# Patient Record
Sex: Female | Born: 1985 | Race: Black or African American | Hispanic: No | Marital: Married | Smoking: Never smoker
Health system: Southern US, Community
[De-identification: ages and names within clinical notes are randomized; demographics above are authoritative.]

## PROBLEM LIST (undated history)

## (undated) ENCOUNTER — Inpatient Hospital Stay (HOSPITAL_COMMUNITY): Payer: Self-pay

## (undated) DIAGNOSIS — M67432 Ganglion, left wrist: Secondary | ICD-10-CM

## (undated) DIAGNOSIS — R1032 Left lower quadrant pain: Secondary | ICD-10-CM

## (undated) DIAGNOSIS — I309 Acute pericarditis, unspecified: Secondary | ICD-10-CM

## (undated) DIAGNOSIS — Z01419 Encounter for gynecological examination (general) (routine) without abnormal findings: Secondary | ICD-10-CM

## (undated) DIAGNOSIS — K219 Gastro-esophageal reflux disease without esophagitis: Secondary | ICD-10-CM

## (undated) DIAGNOSIS — E669 Obesity, unspecified: Secondary | ICD-10-CM

## (undated) DIAGNOSIS — R5383 Other fatigue: Secondary | ICD-10-CM

## (undated) HISTORY — DX: Gastro-esophageal reflux disease without esophagitis: K21.9

## (undated) HISTORY — PX: NO PAST SURGERIES: SHX2092

## (undated) HISTORY — DX: Encounter for gynecological examination (general) (routine) without abnormal findings: Z01.419

## (undated) HISTORY — DX: Acute pericarditis, unspecified: I30.9

## (undated) HISTORY — DX: Left lower quadrant pain: R10.32

## (undated) HISTORY — DX: Obesity, unspecified: E66.9

## (undated) HISTORY — PX: TONSILLECTOMY: SUR1361

## (undated) HISTORY — DX: Other fatigue: R53.83

## (undated) HISTORY — DX: Ganglion, left wrist: M67.432

---

## 2010-04-09 ENCOUNTER — Emergency Department (HOSPITAL_COMMUNITY): Admission: EM | Admit: 2010-04-09 | Discharge: 2010-04-09 | Payer: Self-pay | Admitting: Family Medicine

## 2010-06-27 ENCOUNTER — Other Ambulatory Visit: Admission: RE | Admit: 2010-06-27 | Discharge: 2010-06-27 | Payer: Self-pay | Admitting: Family Medicine

## 2010-06-27 ENCOUNTER — Ambulatory Visit: Payer: Self-pay | Admitting: Family Medicine

## 2010-06-27 DIAGNOSIS — B009 Herpesviral infection, unspecified: Secondary | ICD-10-CM

## 2010-06-27 HISTORY — DX: Herpesviral infection, unspecified: B00.9

## 2010-07-01 ENCOUNTER — Telehealth: Payer: Self-pay | Admitting: Family Medicine

## 2010-07-04 ENCOUNTER — Encounter (INDEPENDENT_AMBULATORY_CARE_PROVIDER_SITE_OTHER): Payer: Self-pay | Admitting: *Deleted

## 2010-07-09 ENCOUNTER — Ambulatory Visit: Payer: Self-pay | Admitting: Family Medicine

## 2010-07-09 LAB — CONVERTED CEMR LAB
BUN: 12 mg/dL (ref 6–23)
Basophils Absolute: 0 10*3/uL (ref 0.0–0.1)
CO2: 26 meq/L (ref 19–32)
Calcium: 9.6 mg/dL (ref 8.4–10.5)
Chloride: 110 meq/L (ref 96–112)
Creatinine, Ser: 0.7 mg/dL (ref 0.4–1.2)
Eosinophils Relative: 2.3 % (ref 0.0–5.0)
Glucose, Bld: 75 mg/dL (ref 70–99)
HDL: 62.3 mg/dL (ref >39.00)
LDL Cholesterol: 91 mg/dL (ref 0–99)
MCV: 86.2 fL (ref 78.0–100.0)
Monocytes Absolute: 0.5 10*3/uL (ref 0.1–1.0)
Monocytes Relative: 9.5 % (ref 3.0–12.0)
Neutrophils Relative %: 38.6 % — ABNORMAL LOW (ref 43.0–77.0)
Platelets: 205 10*3/uL (ref 150.0–400.0)
RDW: 12.3 % (ref 11.5–14.6)
TSH: 1.17 microintl units/mL (ref 0.35–5.50)
VLDL: 18.6 mg/dL (ref 0.0–40.0)
WBC: 5.7 10*3/uL (ref 4.5–10.5)

## 2010-09-10 ENCOUNTER — Telehealth (INDEPENDENT_AMBULATORY_CARE_PROVIDER_SITE_OTHER): Payer: Self-pay | Admitting: *Deleted

## 2010-11-14 ENCOUNTER — Ambulatory Visit
Admission: RE | Admit: 2010-11-14 | Discharge: 2010-11-14 | Payer: Self-pay | Source: Home / Self Care | Attending: Family Medicine | Admitting: Family Medicine

## 2010-11-14 DIAGNOSIS — J019 Acute sinusitis, unspecified: Secondary | ICD-10-CM | POA: Insufficient documentation

## 2010-11-25 NOTE — Progress Notes (Signed)
Summary: NUVARING REFILL--WANTS CHANGE  Phone Note Refill Request Message from:  Fax from Pharmacy on July 01, 2010 2:27 PM  Refills Requested: Medication #1:  NUVARING 0.12-0.015 MG/24HR RING insert as directed.  remove after 3 weeks.  insert new ring after 1 week.Marland Kitchen [facility], [address], [place]    FAX = [phone]   ***HANDWRITTEN NOTE FROM PHARMACY ON FAX--"PT REQUESTS CHANGE TO ORTHO TRI-CYCLEN FOR CUST SAVINGS.   MAY WE CHANGE?"  Initial call taken by: Jerolyn Shin,  July 01, 2010 2:47 PM  Follow-up for Phone Call        Please advise of change. Lucious Groves CMA  July 01, 2010 2:50 PM   Additional Follow-up for Phone Call Additional follow up Details #1::        ok to change.  disp 1 pill pack w/ 11 refills Additional Follow-up by: Neena Rhymes MD,  July 01, 2010 3:08 PM    New/Updated Medications: ORTHO TRI-CYCLEN LO 0.18/0.215/0.25 MG-25 MCG TABS (NORGESTIM-ETH ESTRAD TRIPHASIC) 1 by mouth once daily as directed Prescriptions: ORTHO TRI-CYCLEN LO 0.18/0.215/0.25 MG-25 MCG TABS (NORGESTIM-ETH ESTRAD TRIPHASIC) 1 by mouth once daily as directed  #1 pill pack x 11   Entered by:   Lucious Groves CMA   Authorized by:   Neena Rhymes MD   Signed by:   Lucious Groves CMA on 07/01/2010   Method used:   Faxed to ...       [facility]* (retail)       [address] [place], [place]  [address]       Ph: [phone]       Fax: [phone]   RxID:   [id number]

## 2010-11-25 NOTE — Progress Notes (Signed)
Summary: new rx  Phone Note Call from Patient Call back at Home Phone [phone]   Caller: Patient Summary of Call: patient wants rx for nuvaring - she said ins will pay for it now - [facility] mail order Initial call taken by: Okey Regal [place],  September 10, 2010 10:27 AM  Follow-up for Phone Call        spoke w/ patient aware prescription sent to [facility].......Marland KitchenDoristine Devoid CMA  September 10, 2010 11:03 AM     New/Updated Medications: NUVARING 0.12-0.015 MG/24HR RING (ETONOGESTREL-ETHINYL ESTRADIOL) insert as directed, remove after 3 weeks then insert new ring after 1 wk Prescriptions: NUVARING 0.12-0.015 MG/24HR RING (ETONOGESTREL-ETHINYL ESTRADIOL) insert as directed, remove after 3 weeks then insert new ring after 1 wk  #68month x 3   Entered by:   Doristine Devoid CMA   Authorized by:   Neena Rhymes MD   Signed by:   Doristine Devoid CMA on 09/10/2010   Method used:   Faxed to ...       [facility] (mail-order)             , [place]         Ph: [phone]       Fax: [phone]   RxID:   [id number]

## 2010-11-25 NOTE — Letter (Signed)
Summary: Results Follow up Letter  [facility]  [address] [place], [place] [address]   Phone: [phone]  Fax: [phone]    07/04/2010 MRN: [id number]  Janice Raymond [address] [place], [place]  [address]  Dear Ms. [name],  The following are the results of your recent test(s):  Test         Result    Pap Smear:        Normal __X___  Not Normal _____ Comments: REPEAT 1 YR. ______________________________________________________ Cholesterol: LDL(Bad cholesterol):         Your goal is less than:         HDL (Good cholesterol):       Your goal is more than: Comments:  ______________________________________________________ Mammogram:        Normal _____  Not Normal _____ Comments:  ___________________________________________________________________ Hemoccult:        Normal _____  Not normal _______ Comments:    _____________________________________________________________________ Other Tests:    We routinely do not discuss normal results over the telephone.  If you desire a copy of the results, or you have any questions about this information we can discuss them at your next office visit.   Sincerely,

## 2010-11-25 NOTE — Assessment & Plan Note (Signed)
Summary: cpx and pap   Vital Signs:  Patient profile:   25 year old female Height:      64.50 inches Weight:      149 pounds BMI:     25.27 Pulse rate:   113 / minute BP sitting:   100 / 64  (left arm)  Vitals Entered By: Doristine Devoid CMA (June 27, 2010 2:25 PM) CC: NEW EST- CPX AND PAP- would like to switch to nuva ring   History of Present Illness: 25 yo woman here today to establish care.  previous MD- [facility].  no concerns about health.  wants to start nuvaring for birth control.      Preventive Screening-Counseling & Management  Alcohol-Tobacco     Alcohol drinks/day: <1     Smoking Status: never  Caffeine-Diet-Exercise     Does Patient Exercise: yes     Type of exercise: dance, running      Sexual History:  currently monogamous.        Drug Use:  never.    Current Medications (verified): 1)  Trinessa (28) 0.18/0.215/0.25 Mg-35 Mcg Tabs (Norgestim-Eth Estrad Triphasic) .... Take Daily 2)  Acyclovir 400 Mg Tabs (Acyclovir) .... Take One Tablet Two Times A Day 3)  Nuvaring 0.12-0.015 Mg/24hr Ring (Etonogestrel-Ethinyl Estradiol) .... Insert As Directed.  Remove After 3 Weeks.  Insert New Ring After 1 Week.  Allergies (verified): No Known Drug Allergies  Past History:  Past Medical History: HSV  Past Surgical History: cyst removal L wrist  Family History: CAD-no HTN-grandparents DM-grandparents, mother STROKE-great aunt COLON CA-no BREAST CA-maternal aunt (dx'd at age 66)  Social History: recent grad from [place] works for [employer] lives alone no pets engagedSmoking Status:  never Does Patient Exercise:  yes Sexual History:  currently monogamous Drug Use:  never  Review of Systems  The patient denies anorexia, fever, weight loss, weight gain, vision loss, decreased hearing, hoarseness, chest pain, syncope, dyspnea on exertion, peripheral edema, prolonged cough, headaches, abdominal pain, melena,  hematochezia, severe indigestion/heartburn, hematuria, suspicious skin lesions, depression, abnormal bleeding, enlarged lymph nodes, and breast masses.    Physical Exam  General:  Well-developed,well-nourished,in no acute distress; alert,appropriate and cooperative throughout examination Head:  Normocephalic and atraumatic without obvious abnormalities. No apparent alopecia or balding. Eyes:  No corneal or conjunctival inflammation noted. EOMI. Perrla. Funduscopic exam benign, without hemorrhages, exudates or papilledema. Vision grossly normal. Ears:  External ear exam shows no significant lesions or deformities.  Otoscopic examination reveals clear canals, tympanic membranes are intact bilaterally without bulging, retraction, inflammation or discharge. Hearing is grossly normal bilaterally. Nose:  External nasal examination shows no deformity or inflammation. Nasal mucosa are pink and moist without lesions or exudates. Mouth:  Oral mucosa and oropharynx without lesions or exudates.  Teeth in good repair. Neck:  No deformities, masses, or tenderness noted. Breasts:  No mass, nodules, thickening, tenderness, bulging, retraction, inflamation, nipple discharge or skin changes noted.   Lungs:  Normal respiratory effort, chest expands symmetrically. Lungs are clear to auscultation, no crackles or wheezes. Heart:  Normal rate and regular rhythm. S1 and S2 normal without gallop, murmur, click, rub or other extra sounds. Abdomen:  Bowel sounds positive,abdomen soft and non-tender without masses, organomegaly or hernias noted. Genitalia:  Pelvic Exam:        External: normal female genitalia without lesions or masses        Vagina: normal without lesions or masses  Cervix: normal without lesions or masses        Adnexa: normal bimanual exam without masses or fullness        Uterus: normal by palpation        Pap smear: performed Pulses:  +2 carotid, radial, DP Extremities:  No clubbing, cyanosis,  edema, or deformity noted with normal full range of motion of all joints.   Neurologic:  No cranial nerve deficits noted. Station and gait are normal. Plantar reflexes are down-going bilaterally. DTRs are symmetrical throughout. Sensory, motor and coordinative functions appear intact. Skin:  Intact without suspicious lesions or rashes Cervical Nodes:  No lymphadenopathy noted Axillary Nodes:  No palpable lymphadenopathy Psych:  Cognition and judgment appear intact. Alert and cooperative with normal attention span and concentration. No apparent delusions, illusions, hallucinations   Impression & Recommendations:  Problem # 1:  ROUTINE GYNECOLOGICAL EXAMINATION (ICD-V72.31) Assessment New pt's PE WNL.  not fasting today so will need to return for lab work.  anticipatory guidance provided.  Problem # 2:  SCREENING FOR MALIGNANT NEOPLASM OF THE CERVIX (ICD-V76.2) Assessment: New pap collected  Problem # 3:  CONTRACEPTIVE MANAGEMENT (ICD-V25.09) Assessment: New at pt's request will start NuvaRing.  reviewed proper use and timing of start.  Pt expresses understanding and is in agreement w/ this plan.  Complete Medication List: 1)  Acyclovir 400 Mg Tabs (Acyclovir) .... Take one tablet two times a day 2)  Ortho Tri-cyclen Lo 0.18/0.215/0.25 Mg-25 Mcg Tabs (Norgestim-eth estrad triphasic) .Marland Kitchen.. 1 by mouth once daily as directed  Patient Instructions: 1)  Please schedule a lab visit- do not eat before this appt 2)  BMP prior to visit, ICD-9: v70 3)  Hepatic Panel prior to visit ICD-9: v70 4)  Lipid panel prior to visit ICD-9 : v70 5)  TSH prior to visit ICD-9 : v70 6)  CBC w/ Diff prior to visit ICD-9 : v70 7)  Call with any questions or concerns 8)  We'll notify you of your lab results 9)  Keep making healthy food choices and get regular exercise 10)  Insert your Nuva Ring the "Sunday after you start bleeding- if you don't get your period, please take a pregnancy test before inserting the  ring 11)  Welcome!  We're glad to have you! Prescriptions: NUVARING 0.12-0.015 MG/24HR RING (ETONOGESTREL-ETHINYL ESTRADIOL) insert as directed.  remove after 3 weeks.  insert new ring after 1 week.  #1 x 11   Entered and Authorized by:   Katherine Tabori MD   Signed by:   Katherine Tabori MD on 06/27/2010   Method used:   Electronically to        [facility]* (retail)       [address] [place], [place]  [address]       Ph: [phone]       Fax: [phone]   RxID:   [id number]

## 2010-11-27 NOTE — Assessment & Plan Note (Signed)
Summary: headaches, tonsils swollen, sinus for 2 weeks///sph   Vital Signs:  Patient profile:   25 year old female Weight:      151 pounds BMI:     25.61 Temp:     98.4 degrees F oral BP sitting:   110 / 76  (left arm)  Vitals Entered By: Doristine Devoid CMA (November 14, 2010 2:27 PM) CC: sinus Ha and pressure x2 wks    History of Present Illness: 25 yo woman here today for ? sinus infxn.  reports she is feeling 'perfect today'.  has had 'terrible HAs' for last 2 weeks.  pain was behind L eye.  looked on Web MD and started taking Astepro, Excedrin, Benadryl.  no pain today.  no ear pain.  + nasal congestion.  no cough.  no fevers.  + sick contacts.  herpes- has been taking Acyclovir but doesn't feel this is suppressing her sxs.  wants to know if there is better available.  Current Medications (verified): 1)  Acyclovir 400 Mg Tabs (Acyclovir) .... Take One Tablet Two Times A Day 2)  Nuvaring 0.12-0.015 Mg/24hr Ring (Etonogestrel-Ethinyl Estradiol) .... Insert As Directed, Remove After 3 Weeks Then Insert New Ring After 1 Wk  Allergies (verified): No Known Drug Allergies  Review of Systems      See HPI  Physical Exam  General:  Well-developed,well-nourished,in no acute distress; alert,appropriate and cooperative throughout examination Head:  Normocephalic and atraumatic without obvious abnormalities. No apparent alopecia or balding.  mild TTP over L frontal sinus Eyes:  no injxn or inflammation Ears:  External ear exam shows no significant lesions or deformities.  Otoscopic examination reveals clear canals, tympanic membranes are intact bilaterally without bulging, retraction, inflammation or discharge. Hearing is grossly normal bilaterally. Nose:  External nasal examination shows no deformity or inflammation. Nasal mucosa are pink and moist without lesions or exudates. Mouth:  Oral mucosa and oropharynx without lesions or exudates.  Teeth in good repair.  + PND Neck:  No  deformities, masses, or tenderness noted. Lungs:  Normal respiratory effort, chest expands symmetrically. Lungs are clear to auscultation, no crackles or wheezes. Heart:  Normal rate and regular rhythm. S1 and S2 normal without gallop, murmur, click, rub or other extra sounds.   Impression & Recommendations:  Problem # 1:  SINUSITIS - ACUTE-NOS (ICD-461.9) Assessment New resolving w/out need for abx.  pt to continue Astepro and restart her Zyrtec D.  reviewed supportive care and red flags that should prompt return.  Pt expresses understanding and is in agreement w/ this plan.  Problem # 2:  HSV (ICD-054.9) Assessment: Unchanged switch to Valtrex as pt prefers to try a once daily medicine  Complete Medication List: 1)  Valtrex 500 Mg Tabs (Valacyclovir hcl) .Marland Kitchen.. 1 tab daily for suppression 2)  Nuvaring 0.12-0.015 Mg/24hr Ring (Etonogestrel-ethinyl estradiol) .... Insert as directed, remove after 3 weeks then insert new ring after 1 wk  Patient Instructions: 1)  It appears that you had a sinus infection/inflammation that improved 2)  Start Zyrtec D daily until you feel better 3)  Continue the Astepro to shrink the nasal swelling and decrease the post nasal drip 4)  Drink plenty of fluids 5)  Call with any questions or concerns 6)  Hang in there!!! Prescriptions: VALTREX 500 MG TABS (VALACYCLOVIR HCL) 1 tab daily for suppression  #90 x 3   Entered and Authorized by:   Neena Rhymes MD   Signed by:   Neena Rhymes MD on 11/14/2010  Method used:   Faxed to ...       [facility] (mail-order)             , [place]         Ph: [phone]       Fax: [phone]   RxID:   [id number]    Orders Added: 1)  Est. Patient Level III [41324]

## 2010-12-23 ENCOUNTER — Encounter: Payer: Self-pay | Admitting: Family Medicine

## 2010-12-23 ENCOUNTER — Ambulatory Visit (INDEPENDENT_AMBULATORY_CARE_PROVIDER_SITE_OTHER): Payer: 59 | Admitting: Family Medicine

## 2010-12-23 DIAGNOSIS — N898 Other specified noninflammatory disorders of vagina: Secondary | ICD-10-CM | POA: Insufficient documentation

## 2010-12-24 LAB — CONVERTED CEMR LAB
Candida species: POSITIVE — AB
Gardnerella vaginalis: POSITIVE — AB

## 2010-12-25 ENCOUNTER — Telehealth (INDEPENDENT_AMBULATORY_CARE_PROVIDER_SITE_OTHER): Payer: Self-pay | Admitting: *Deleted

## 2011-01-01 NOTE — Progress Notes (Signed)
Summary: wet prep-  Phone Note Outgoing Call   Call placed by: Doristine Devoid CMA,  December 25, 2010 9:17 AM Call placed to: Patient Summary of Call: already being treated for the yeast but also has BV.  should start Metronidazole 500mg  two times a day x7 days.  she had asked about drinking ETOH on meds- if she drinks on the Metronidazole this will make her sick   Follow-up for Phone Call        left message on machine .......Marland KitchenDoristine Devoid CMA  December 25, 2010 9:17 AM   Additional Follow-up for Phone Call Additional follow up Details #1::        Discuss with patient, Rx sent to pharmacy.Marland KitchenMarland KitchenFelecia Deloach CMA  December 25, 2010 10:58 AM     New/Updated Medications: METRONIDAZOLE 500 MG TABS (METRONIDAZOLE) Take 1 tab two times a day x7 days Prescriptions: METRONIDAZOLE 500 MG TABS (METRONIDAZOLE) Take 1 tab two times a day x7 days  #14 x 0   Entered by:   Jeremy Johann CMA   Authorized by:   Neena Rhymes MD   Signed by:   Jeremy Johann CMA on 12/25/2010   Method used:   Faxed to ...       [facility]* (retail)       [address] [place], [place]  [address]       Ph: [phone]       Fax: [phone]   RxID:   [id number]

## 2011-01-01 NOTE — Assessment & Plan Note (Signed)
Summary: vaginal discharge,cbs   Vital Signs:  Patient profile:   25 year old female Weight:      147 pounds BMI:     24.93 Pulse rate:   68 / minute BP sitting:   112 / 70  (left arm)  Vitals Entered By: Doristine Devoid CMA (December 23, 2010 9:20 AM) CC: vaginal discharge xfri   History of Present Illness: 25 yo woman here today for vaginal d/c.  sxs started after using abx for sinus infxn.  used monistat w/ initial sxs relief.  sxs started again friday after sex.  'lots of discharge'- thick, white discharge.  + itching, burning internally.  Current Medications (verified): 1)  Valtrex 500 Mg Tabs (Valacyclovir Hcl) .Marland Kitchen.. 1 Tab Daily For Suppression 2)  Nuvaring 0.12-0.015 Mg/24hr Ring (Etonogestrel-Ethinyl Estradiol) .... Insert As Directed, Remove After 3 Weeks Then Insert New Ring After 1 Wk  Allergies (verified): No Known Drug Allergies  Review of Systems      See HPI  Physical Exam  General:  Well-developed,well-nourished,in no acute distress; alert,appropriate and cooperative throughout examination Genitalia:  normal introitus and no external lesions.  thick, white vaginal d/c consistent w/ yeast.   Impression & Recommendations:  Problem # 1:  VAGINAL DISCHARGE (ICD-623.5) Assessment New pt's sxs consistent w/ yeast.  start Diflucan.  reviewed supportive care and red flags that should prompt return.  Pt expresses understanding and is in agreement w/ this plan. Orders: Specimen Handling ([address]) T-Wet Prep ([id number])  Complete Medication List: 1)  Valtrex 500 Mg Tabs (Valacyclovir hcl) .Marland Kitchen.. 1 tab daily for suppression 2)  Nuvaring 0.12-0.015 Mg/24hr Ring (Etonogestrel-ethinyl estradiol) .... Insert as directed, remove after 3 weeks then insert new ring after 1 wk 3)  Diflucan 150 Mg Tabs (Fluconazole) .... Once daily.  repeat in 3 days.  Patient Instructions: 1)  Take the Diflucan as directed 2)  Call with any questions or concerns 3)  Hang in  there! Prescriptions: VALTREX 500 MG TABS (VALACYCLOVIR HCL) 1 tab daily for suppression  #90 x 3   Entered and Authorized by:   Neena Rhymes MD   Signed by:   Neena Rhymes MD on 12/23/2010   Method used:   Faxed to ...       [facility] (mail-order)             , [place]         Ph: [phone]       Fax: [phone]   RxID:   [id number] DIFLUCAN 150 MG TABS (FLUCONAZOLE) once daily.  repeat in 3 days.  #2 x 0   Entered and Authorized by:   Neena Rhymes MD   Signed by:   Neena Rhymes MD on 12/23/2010   Method used:   Electronically to        [facility]* (retail)       [address] [place], [place]  [address]       Ph: [phone]       Fax: [phone]   RxID:   [id number]    Orders Added: 1)  Specimen Handling [99000] 2)  T-Wet Prep [[id number]] 3)  Est. Patient Level III [57322]

## 2011-06-15 ENCOUNTER — Other Ambulatory Visit: Payer: Self-pay | Admitting: Family Medicine

## 2011-06-15 NOTE — Telephone Encounter (Signed)
Sprintec is a generic birth control pill that costs $9 or $10/month at [facility].  This would be cheaper than [facility].  Ok to refill valtrex for 6 months

## 2011-06-16 MED ORDER — VALACYCLOVIR HCL 500 MG PO TABS: 500.0000 mg | ORAL_TABLET | Freq: Every day | ORAL | Status: AC

## 2011-06-16 MED ORDER — NORGESTIMATE-ETH ESTRADIOL 0.25-35 MG-MCG PO TABS: 1.0000 | ORAL_TABLET | Freq: Every day | ORAL | Status: DC

## 2011-06-16 NOTE — Telephone Encounter (Signed)
Pt return call [facility], [facility] is closes, Rx sent to pharmacy. Pt aware.

## 2011-06-16 NOTE — Telephone Encounter (Signed)
Left message to notify pt of Dr. Rennis Golden recommendation. Advised pt to return call with preferred  [facility] location

## 2011-06-25 ENCOUNTER — Telehealth: Payer: Self-pay | Admitting: Family Medicine

## 2011-06-25 MED ORDER — ACYCLOVIR 400 MG PO TABS: 400.0000 mg | ORAL_TABLET | Freq: Two times a day (BID) | ORAL | Status: DC

## 2011-06-25 NOTE — Telephone Encounter (Signed)
Ok to switch to acyclovir 400mg , 1 tab po bid for herpes suppression.  #60, 6 refills

## 2011-06-25 NOTE — Telephone Encounter (Signed)
Done and pt aware

## 2011-06-30 ENCOUNTER — Telehealth: Payer: Self-pay

## 2011-06-30 NOTE — Telephone Encounter (Signed)
Pt c/o concerns with menstruation. Pt d/c nuvaring and and began sprintec and is concerned because she is bleeding heavily.  Per Dr. Beverely Low, it is going to take 1-2 full packs of the pill for pt's body to adjust to the new birth control because of the way she switched the medications.

## 2011-08-19 ENCOUNTER — Other Ambulatory Visit: Payer: Self-pay | Admitting: Family Medicine

## 2011-08-20 MED ORDER — ACYCLOVIR 400 MG PO TABS: 400.0000 mg | ORAL_TABLET | Freq: Two times a day (BID) | ORAL | Status: DC

## 2011-08-20 NOTE — Telephone Encounter (Signed)
Ok for acyclovir 400mg  bid, #60, 6 refills

## 2011-09-07 LAB — HM PAP SMEAR: HM Pap smear: NORMAL

## 2012-08-29 ENCOUNTER — Other Ambulatory Visit: Payer: Self-pay

## 2012-08-29 MED ORDER — NORGESTIMATE-ETH ESTRADIOL 0.25-35 MG-MCG PO TABS: 1.0000 | ORAL_TABLET | Freq: Every day | ORAL | Status: DC

## 2012-08-29 NOTE — Telephone Encounter (Signed)
Spoke to pt to verify pharmacy, printed Rx and faxed pt aware.     MW

## 2012-08-29 NOTE — Telephone Encounter (Signed)
LMOVm for pt to return call.  

## 2012-08-29 NOTE — Telephone Encounter (Signed)
Does pt want Nuva Ring or Sprintec?  Can fill script for 1 month but needs OV at least yearly to continue to provide meds

## 2012-08-29 NOTE — Telephone Encounter (Signed)
Pt states hasn't been here in a year because lost job and insurance OV1/2012 Pt requesting refill on contraception nevo ring. Does pt need appt?  Plz advise   MW

## 2012-09-06 NOTE — Telephone Encounter (Signed)
Patient left vm on triage line stating she wants Nuvaring not Sprintec.

## 2012-09-06 NOTE — Telephone Encounter (Signed)
Patient left vm on triage line stating she wants Nuvaring not Sprintec.    MW

## 2012-09-06 NOTE — Telephone Encounter (Signed)
Ok for #3, no refills.  This will get pt to her appt

## 2012-09-06 NOTE — Telephone Encounter (Signed)
If pt thinks she will again have insurance we can continue her medication.  If she doesn't think she will get insurance, she will need to go to the Health Dept for her birth control b/c I cannot continue to write meds for people who are not being seen yearly.  We can give her a 1 month supply to allow her to get to the health dept

## 2012-09-06 NOTE — Telephone Encounter (Signed)
Called pt she verified her pharmacy. Pt states that she now has a full time job and health insurance no longer unemployed and on medicaid so she can see Tabori again. Pt has a future appt scheduled for 11/01/12. Pt requesting Nuva ring plz give enough until appt.   Plz advise   MW

## 2012-09-07 MED ORDER — ETONOGESTREL-ETHINYL ESTRADIOL 0.12-0.015 MG/24HR VA RING: VAGINAL_RING | VAGINAL | Status: DC

## 2012-09-07 NOTE — Telephone Encounter (Signed)
Rx sent.    MW 

## 2012-09-07 NOTE — Addendum Note (Signed)
Addended by: Mal Amabile on: 09/07/2012 08:02 AM   Modules accepted: Orders

## 2012-09-19 ENCOUNTER — Ambulatory Visit (INDEPENDENT_AMBULATORY_CARE_PROVIDER_SITE_OTHER): Payer: 59 | Admitting: Family Medicine

## 2012-09-19 ENCOUNTER — Encounter: Payer: Self-pay | Admitting: Family Medicine

## 2012-09-19 ENCOUNTER — Other Ambulatory Visit (HOSPITAL_COMMUNITY)
Admission: RE | Admit: 2012-09-19 | Discharge: 2012-09-19 | Disposition: A | Discharge status: Home / Self Care | Payer: 59 | Source: Ambulatory Visit | Attending: Family Medicine | Admitting: Family Medicine

## 2012-09-19 VITALS — BP 104/68 | HR 90 | Temp 97.9°F | Wt 160.0 lb

## 2012-09-19 DIAGNOSIS — Z124 Encounter for screening for malignant neoplasm of cervix: Secondary | ICD-10-CM

## 2012-09-19 DIAGNOSIS — Z01419 Encounter for gynecological examination (general) (routine) without abnormal findings: Secondary | ICD-10-CM | POA: Insufficient documentation

## 2012-09-19 HISTORY — DX: Encounter for gynecological examination (general) (routine) without abnormal findings: Z01.419

## 2012-09-19 LAB — TSH: TSH: 1.62 u[IU]/mL (ref 0.35–5.50)

## 2012-09-19 LAB — LDL CHOLESTEROL, DIRECT: Direct LDL: 95.3 mg/dL

## 2012-09-19 LAB — HEPATIC FUNCTION PANEL
AST: 21 U/L (ref 0–37)
Albumin: 4 g/dL (ref 3.5–5.2)
Total Bilirubin: 0.4 mg/dL (ref 0.3–1.2)

## 2012-09-19 LAB — POCT URINALYSIS DIPSTICK
Bilirubin, UA: NEGATIVE
Glucose, UA: NEGATIVE
Nitrite, UA: NEGATIVE
Spec Grav, UA: 1.02
Urobilinogen, UA: 0.2

## 2012-09-19 LAB — BASIC METABOLIC PANEL
BUN: 13 mg/dL (ref 6–23)
Calcium: 9.6 mg/dL (ref 8.4–10.5)
Creatinine, Ser: 0.6 mg/dL (ref 0.4–1.2)
GFR: 163.68 mL/min (ref >60.00)
Glucose, Bld: 77 mg/dL (ref 70–99)
Potassium: 4.6 mEq/L (ref 3.5–5.1)

## 2012-09-19 LAB — CBC WITH DIFFERENTIAL/PLATELET
Basophils Relative: 0.3 % (ref 0.0–3.0)
Eosinophils Absolute: 0.1 10*3/uL (ref 0.0–0.7)
Eosinophils Relative: 1 % (ref 0.0–5.0)
HCT: 39.4 % (ref 36.0–46.0)
Hemoglobin: 12.5 g/dL (ref 12.0–15.0)
Lymphs Abs: 3.1 10*3/uL (ref 0.7–4.0)
MCHC: 31.6 g/dL (ref 30.0–36.0)
MCV: 85.8 fl (ref 78.0–100.0)
Monocytes Absolute: 0.7 10*3/uL (ref 0.1–1.0)
Neutro Abs: 4 10*3/uL (ref 1.4–7.7)
RBC: 4.59 Mil/uL (ref 3.87–5.11)
WBC: 7.9 10*3/uL (ref 4.5–10.5)

## 2012-09-19 LAB — LIPID PANEL
Cholesterol: 203 mg/dL — ABNORMAL HIGH (ref 0–200)
HDL: 92.8 mg/dL (ref >39.00)

## 2012-09-19 NOTE — Progress Notes (Signed)
  Subjective:    Patient ID: Janice Raymond, female    DOB: 02/22/1986, 26 y.o.   MRN: [id number]  HPI CPE- no concerns   Review of Systems Patient reports no vision/ hearing changes, adenopathy,fever, weight change,  persistant/recurrent hoarseness , swallowing issues, chest pain, palpitations, edema, persistant/recurrent cough, hemoptysis, dyspnea (rest/exertional/paroxysmal nocturnal), gastrointestinal bleeding (melena, rectal bleeding), abdominal pain, significant heartburn, bowel changes, GU symptoms (dysuria, hematuria, incontinence), Gyn symptoms (abnormal  bleeding, pain),  syncope, focal weakness, memory loss, numbness & tingling, skin/hair/nail changes, abnormal bruising or bleeding, anxiety, or depression.     Objective:   Physical Exam  General Appearance:    Alert, cooperative, no distress, appears stated age  Head:    Normocephalic, without obvious abnormality, atraumatic  Eyes:    PERRL, conjunctiva/corneas clear, EOM's intact, fundi    benign, both eyes  Ears:    Normal TM's and external ear canals, both ears  Nose:   Nares normal, septum midline, mucosa normal, no drainage    or sinus tenderness  Throat:   Lips, mucosa, and tongue normal; teeth and gums normal  Neck:   Supple, symmetrical, trachea midline, no adenopathy;    Thyroid: no enlargement/tenderness/nodules  Back:     Symmetric, no curvature, ROM normal, no CVA tenderness  Lungs:     Clear to auscultation bilaterally, respirations unlabored  Chest Wall:    No tenderness or deformity   Heart:    Regular rate and rhythm, S1 and S2 normal, no murmur, rub   or gallop  Breast Exam:    No tenderness, masses, or nipple abnormality  Abdomen:     Soft, non-tender, bowel sounds active all four quadrants,    no masses, no organomegaly  Genitalia:    External genitalia normal, cervix normal in appearance, no CMT, uterus in normal size and position, adnexa w/out mass or tenderness, mucosa pink and moist, no lesions or  discharge present  Rectal:    Normal external appearance  Extremities:   Extremities normal, atraumatic, no cyanosis or edema  Pulses:   2+ and symmetric all extremities  Skin:   Skin color, texture, turgor normal, no rashes or lesions  Lymph nodes:   Cervical, supraclavicular, and axillary nodes normal  Neurologic:   CNII-XII intact, normal strength, sensation and reflexes    throughout          Assessment & Plan:

## 2012-09-19 NOTE — Assessment & Plan Note (Signed)
Pap collected. 

## 2012-09-19 NOTE — Patient Instructions (Addendum)
Follow up in 1 year or as needed We'll notify you of your lab results and make any changes if needed Keep up the good work!  You look great! Happy Holidays and happy early birthday!

## 2012-09-19 NOTE — Assessment & Plan Note (Signed)
Pt's PE WNL.  Check labs.  Anticipatory guidance provided.  

## 2012-09-23 LAB — VITAMIN D 1,25 DIHYDROXY: Vitamin D3 1, 25 (OH)2: 110 pg/mL

## 2012-10-10 ENCOUNTER — Encounter: Payer: Self-pay | Admitting: *Deleted

## 2012-11-01 ENCOUNTER — Ambulatory Visit: Payer: 59 | Admitting: Family Medicine

## 2012-11-14 ENCOUNTER — Telehealth: Payer: Self-pay | Admitting: Family Medicine

## 2012-11-14 DIAGNOSIS — IMO0001 Reserved for inherently not codable concepts without codable children: Secondary | ICD-10-CM

## 2012-11-14 MED ORDER — ETONOGESTREL-ETHINYL ESTRADIOL 0.12-0.015 MG/24HR VA RING: VAGINAL_RING | VAGINAL | Status: DC

## 2012-11-14 NOTE — Telephone Encounter (Signed)
[facility]

## 2012-11-14 NOTE — Telephone Encounter (Signed)
NUVARING VAGINAL RING Last filled 11.13.2013 Qty:30 Insert vaginally and leave in place for 3 consecutive weeks, then remove for 1 week

## 2012-12-06 ENCOUNTER — Ambulatory Visit: Payer: 59 | Admitting: Family Medicine

## 2012-12-16 ENCOUNTER — Other Ambulatory Visit: Payer: Self-pay | Admitting: *Deleted

## 2012-12-16 MED ORDER — ETONOGESTREL-ETHINYL ESTRADIOL 0.12-0.015 MG/24HR VA RING: VAGINAL_RING | VAGINAL | Status: DC

## 2012-12-16 NOTE — Telephone Encounter (Signed)
Rx sent to the pharmacy([facility]) by e-script.//AB/CMA 

## 2012-12-22 ENCOUNTER — Telehealth: Payer: Self-pay | Admitting: Family Medicine

## 2013-01-11 ENCOUNTER — Telehealth: Payer: Self-pay | Admitting: Family Medicine

## 2013-01-11 NOTE — Telephone Encounter (Signed)
Patient Information:  Caller Name: Reynalda  Phone: [phone]  Patient: Janice Raymond, Janice Raymond  Gender: Female  DOB: April 05, 1986  Age: 27 Years  PCP: Sheliah Hatch  Pregnant: No  Office Follow Up:  Does the office need to follow up with this patient?: No  Instructions For The Office: N/A   Symptoms  Reason For Call & Symptoms: Pt calling task how long does it take for yeast infection to go away after using Monistat.  Pt refused triage.  Reviewed Health History In EMR: N/A  Reviewed Medications In EMR: N/A  Reviewed Allergies In EMR: N/A  Reviewed Surgeries / Procedures: N/A  Date of Onset of Symptoms: 01/09/2013 OB / GYN:  LMP: Unknown  Guideline(s) Used:  No Protocol Available - Information Only  Disposition Per Guideline:   Home Care  Reason For Disposition Reached:   Information only question and nurse able to answer  Advice Given:  Call Back If:  New symptoms develop  You become worse.  Patient Refused Recommendation:  Patient Will Follow Up With Office Later  Pt does not want to pay the copay to come to the office, will try OTC and call back if needs to.

## 2013-01-16 ENCOUNTER — Ambulatory Visit (INDEPENDENT_AMBULATORY_CARE_PROVIDER_SITE_OTHER): Payer: 59 | Admitting: Family Medicine

## 2013-01-16 ENCOUNTER — Encounter: Payer: Self-pay | Admitting: Family Medicine

## 2013-01-16 VITALS — BP 110/70 | HR 101 | Temp 97.5°F | Ht 65.25 in | Wt 166.8 lb

## 2013-01-16 DIAGNOSIS — R1032 Left lower quadrant pain: Secondary | ICD-10-CM | POA: Insufficient documentation

## 2013-01-16 DIAGNOSIS — N898 Other specified noninflammatory disorders of vagina: Secondary | ICD-10-CM

## 2013-01-16 HISTORY — DX: Left lower quadrant pain: R10.32

## 2013-01-16 MED ORDER — FLUCONAZOLE 150 MG PO TABS: 150.0000 mg | ORAL_TABLET | Freq: Once | ORAL | Status: DC

## 2013-01-16 NOTE — Patient Instructions (Addendum)
We'll call you with your [place] appt Take the Diflucan as directed Avoid creams, wipes, etc in the vagina- this will further irritate it Call with any questions or concerns Hang in there!

## 2013-01-16 NOTE — Progress Notes (Signed)
  Subjective:    Patient ID: Janice Raymond, female    DOB: [birth year], 27 y.o.   MRN: [id number]  HPI abd cramping- lower L, hx of similar.  Intermittent.  Pain will last 20-30 minutes.  Not severe.  No N/V/D.  On NuvaRing.  No abd bleeding.  No change in pain w/ BM.  + family hx of fibroids.  Vaginal d/c- pt reports she has been having itching, burning, vaginal d/c for 10 days.  Used OTC monistat 1 day cream.  Pt reports skin then looked like it was peeling- wore thongs that she doesn't usually wear.  Now having painful intercourse.   Review of Systems For ROS see HPI     Objective:   Physical Exam  Vitals reviewed. Constitutional: She appears well-developed and well-nourished. No distress.  Abdominal: Soft. Bowel sounds are normal. She exhibits no distension and no mass. There is tenderness (mild TTP over LLQ/pelvis). There is no rebound and no guarding.  Genitourinary:  Deferred at pt's request          Assessment & Plan:

## 2013-01-18 ENCOUNTER — Ambulatory Visit (HOSPITAL_BASED_OUTPATIENT_CLINIC_OR_DEPARTMENT_OTHER): Payer: 59

## 2013-01-20 NOTE — Assessment & Plan Note (Signed)
Based on description of sxs will start diflucan.  Pt declined vaginal exam today.  If no improvement on diflucan, pt will return for wet prep and STD testing.  Pt expressed understanding and is in agreement w/ plan.

## 2013-01-20 NOTE — Assessment & Plan Note (Signed)
New.  Get [place] to assess for fibroids or ovarian cyst.  NSAIDs prn.  Reviewed supportive care and red flags that should prompt return.  Pt expressed understanding and is in agreement w/ plan.

## 2013-06-27 ENCOUNTER — Telehealth: Payer: Self-pay | Admitting: General Practice

## 2013-06-27 MED ORDER — ACYCLOVIR 400 MG PO TABS: 400.0000 mg | ORAL_TABLET | Freq: Two times a day (BID) | ORAL | Status: AC

## 2013-06-27 NOTE — Telephone Encounter (Signed)
Pt called wanting to get a refill on acyclovir [facility] to [facility].   Last OV 01-16-2013 Med last filled 08-20-2011 #60 with 6 refills

## 2013-06-27 NOTE — Telephone Encounter (Signed)
Med filled.  

## 2013-06-27 NOTE — Telephone Encounter (Signed)
Pt.notified

## 2013-06-27 NOTE — Telephone Encounter (Signed)
Ok for #90, 3 [facility]ills [facility]

## 2013-07-11 ENCOUNTER — Ambulatory Visit (INDEPENDENT_AMBULATORY_CARE_PROVIDER_SITE_OTHER): Payer: 59 | Admitting: Family Medicine

## 2013-07-11 VITALS — BP 110/78 | HR 101 | Temp 98.3°F | Ht 65.25 in | Wt 170.4 lb

## 2013-07-11 DIAGNOSIS — N898 Other specified noninflammatory disorders of vagina: Secondary | ICD-10-CM

## 2013-07-11 DIAGNOSIS — N760 Acute vaginitis: Secondary | ICD-10-CM | POA: Insufficient documentation

## 2013-07-11 MED ORDER — FLUCONAZOLE 150 MG PO TABS: 150.0000 mg | ORAL_TABLET | Freq: Once | ORAL | Status: DC

## 2013-07-11 MED ORDER — NYSTATIN 100000 UNIT/GM EX OINT: TOPICAL_OINTMENT | Freq: Two times a day (BID) | CUTANEOUS | Status: DC

## 2013-07-11 NOTE — Progress Notes (Signed)
  Subjective:    Patient ID: Janice Raymond, female    DOB: 10-10-1986, 27 y.o.   MRN: [id number]  HPI Vaginal pain- 'my lady area hurts'.  Burning, itching.  Taking acyclovir w/out relief.  sxs started w/ onset of menses last week.  Pain is external.  No discharge.  No odor.  No pain w/ urination.   Review of Systems For ROS see HPI     Objective:   Physical Exam  Vitals reviewed. Constitutional: She appears well-developed and well-nourished. No distress.  Genitourinary: There is rash and tenderness on the right labia. There is no lesion or injury on the right labia. There is rash and tenderness on the left labia. There is no lesion or injury on the left labia. No erythema, tenderness or bleeding around the vagina. No vaginal discharge found.  Erythematous, peeling, raw appearing skin on labia bilaterally No evidence of herpetic outbreak          Assessment & Plan:

## 2013-07-11 NOTE — Patient Instructions (Addendum)
Take the Diflucan pill Use the ointment twice daily to improve the pain We'll notify you of your lab results and make any changes if needed Call with any questions or concerns Hang in there!

## 2013-07-12 ENCOUNTER — Encounter: Payer: Self-pay | Admitting: *Deleted

## 2013-07-12 LAB — WET PREP BY MOLECULAR PROBE
Candida species: NEGATIVE
Gardnerella vaginalis: NEGATIVE
Trichomonas vaginosis: NEGATIVE

## 2013-07-17 ENCOUNTER — Telehealth: Payer: Self-pay | Admitting: General Practice

## 2013-07-17 DIAGNOSIS — N760 Acute vaginitis: Secondary | ICD-10-CM

## 2013-07-17 NOTE — Telephone Encounter (Signed)
Pt will need GYN referral if pain is not improving.  Continue antifungal ointment.  Hesitate to use steroids in such a sensitive area.  Given raw appearance of labia, should be seen ASAP

## 2013-07-17 NOTE — Assessment & Plan Note (Signed)
New.  Severe.  Pt w/ raw, peeling skin on labia bilaterally.  No evidence of internal infxn but wet prep sent.  Suspect topical yeast.  Start antifungal cream.  Reviewed supportive care and red flags that should prompt return.  Pt expressed understanding and is in agreement w/ plan.

## 2013-07-17 NOTE — Telephone Encounter (Signed)
Message on triage line: her vaginal pain is not getting any better please advise if there is another medication she can take?

## 2013-07-17 NOTE — Telephone Encounter (Signed)
Urgent referral placed. Will notify pt.

## 2013-09-20 ENCOUNTER — Encounter: Payer: 59 | Admitting: Family Medicine

## 2013-09-29 ENCOUNTER — Encounter: Payer: Self-pay | Admitting: Family Medicine

## 2013-09-29 ENCOUNTER — Ambulatory Visit (INDEPENDENT_AMBULATORY_CARE_PROVIDER_SITE_OTHER): Payer: 59 | Admitting: Family Medicine

## 2013-09-29 VITALS — BP 110/80 | HR 98 | Temp 98.1°F | Resp 16 | Ht 65.75 in | Wt 174.2 lb

## 2013-09-29 DIAGNOSIS — Z01419 Encounter for gynecological examination (general) (routine) without abnormal findings: Secondary | ICD-10-CM

## 2013-09-29 DIAGNOSIS — Z0000 Encounter for general adult medical examination without abnormal findings: Secondary | ICD-10-CM

## 2013-09-29 MED ORDER — LORCASERIN HCL 10 MG PO TABS: 1.0000 | ORAL_TABLET | Freq: Two times a day (BID) | ORAL | Status: DC

## 2013-09-29 NOTE — Progress Notes (Signed)
   Subjective:    Patient ID: Janice Raymond, female    DOB: 04-Mar-1986, 26 y.o.   MRN: [id number]  HPI Pre visit review using our clinic review tool, if applicable. No additional management support is needed unless otherwise documented below in the visit note.  CPE- UTD on pap.   Review of Systems Patient reports no vision/ hearing changes, adenopathy,fever, weight change,  persistant/recurrent hoarseness , swallowing issues, chest pain, palpitations, edema, persistant/recurrent cough, hemoptysis, dyspnea (rest/exertional/paroxysmal nocturnal), gastrointestinal bleeding (melena, rectal bleeding), abdominal pain, significant heartburn, bowel changes, GU symptoms (dysuria, hematuria, incontinence), Gyn symptoms (abnormal  bleeding, pain), syncope, focal weakness, memory loss, numbness & tingling, skin/hair/nail changes, abnormal bruising or bleeding, anxiety, or depression.     Objective:   Physical Exam  General Appearance:    Alert, cooperative, no distress, appears stated age  Head:    Normocephalic, without obvious abnormality, atraumatic  Eyes:    PERRL, conjunctiva/corneas clear, EOM's intact, fundi    benign, both eyes  Ears:    Normal TM's and external ear canals, both ears  Nose:   Nares normal, septum midline, mucosa normal, no drainage    or sinus tenderness  Throat:   Lips, mucosa, and tongue normal; teeth and gums normal  Neck:   Supple, symmetrical, trachea midline, no adenopathy;    Thyroid: no enlargement/tenderness/nodules  Back:     Symmetric, no curvature, ROM normal, no CVA tenderness  Lungs:     Clear to auscultation bilaterally, respirations unlabored  Chest Wall:    No tenderness or deformity   Heart:    Regular rate and rhythm, S1 and S2 normal, no murmur, rub   or gallop  Breast Exam:    No tenderness, masses, or nipple abnormality  Abdomen:     Soft, non-tender, bowel sounds active all four quadrants,    no masses, no organomegaly  Genitalia:    deferred    Rectal:    Extremities:   Extremities normal, atraumatic, no cyanosis or edema  Pulses:   2+ and symmetric all extremities  Skin:   Skin color, texture, turgor normal, no rashes or lesions  Lymph nodes:   Cervical, supraclavicular, and axillary nodes normal  Neurologic:   CNII-XII intact, normal strength, sensation and reflexes    throughout          Assessment & Plan:

## 2013-09-29 NOTE — Patient Instructions (Addendum)
Follow up in 1 year or as needed We'll notify you of your lab results and make any changes if needed Keep up the good work!  You look great! Start the Belviq as directed- make sure you are making healthy food choices and getting regular exercise Call with any questions or concerns Happy BIRTHDAY and Janice Raymond Christmas!

## 2013-10-01 NOTE — Assessment & Plan Note (Signed)
Pt's PE WNL.  UTD on pap.  Check labs.  Anticipatory guidance provided.  

## 2013-10-11 ENCOUNTER — Ambulatory Visit (INDEPENDENT_AMBULATORY_CARE_PROVIDER_SITE_OTHER): Payer: 59 | Admitting: *Deleted

## 2013-10-11 ENCOUNTER — Encounter: Payer: Self-pay | Admitting: *Deleted

## 2013-10-11 DIAGNOSIS — Z23 Encounter for immunization: Secondary | ICD-10-CM

## 2013-12-28 ENCOUNTER — Ambulatory Visit (INDEPENDENT_AMBULATORY_CARE_PROVIDER_SITE_OTHER): Payer: 59 | Admitting: Family Medicine

## 2013-12-28 ENCOUNTER — Encounter: Payer: Self-pay | Admitting: Family Medicine

## 2013-12-28 VITALS — BP 108/74 | HR 94 | Temp 98.2°F | Resp 16 | Wt 172.5 lb

## 2013-12-28 DIAGNOSIS — IMO0001 Reserved for inherently not codable concepts without codable children: Secondary | ICD-10-CM

## 2013-12-28 DIAGNOSIS — Z309 Encounter for contraceptive management, unspecified: Secondary | ICD-10-CM

## 2013-12-28 DIAGNOSIS — E669 Obesity, unspecified: Secondary | ICD-10-CM | POA: Insufficient documentation

## 2013-12-28 HISTORY — DX: Obesity, unspecified: E66.9

## 2013-12-28 MED ORDER — ETONOGESTREL-ETHINYL ESTRADIOL 0.12-0.015 MG/24HR VA RING: VAGINAL_RING | VAGINAL | Status: DC

## 2013-12-28 NOTE — Progress Notes (Signed)
   Subjective:    Patient ID: Eather Colasdrian Dost, female    DOB: Dec 07, 1985, 28 y.o.   MRN: [id number]  HPI Weight loss- pt reports the Belviq worked really well the 1st month and now that she's taking the 2nd month it's not as effective.  Admits to eating and drinking more now that school's back in session w/ mid-terms.  Pt is eating fast food and takeout.   Review of Systems For ROS see HPI     Objective:   Physical Exam  Vitals reviewed. Constitutional: She is oriented to person, place, and time. She appears well-developed and well-nourished. No distress.  Cardiovascular: Normal rate, regular rhythm and normal heart sounds.   Pulmonary/Chest: Effort normal and breath sounds normal. No respiratory distress. She has no wheezes. She has no rales.  Neurological: She is alert and oriented to person, place, and time.  Skin: Skin is warm and dry.  Psychiatric: She has a normal mood and affect. Her behavior is normal. Thought content normal.          Assessment & Plan:

## 2013-12-28 NOTE — Patient Instructions (Signed)
Follow up in 4-6 weeks to recheck weight loss progress Try and make healthy food choices- this will require planning ahead Goal for exercise is 3-4x/week for at least 30 minutes Call with any questions or concerns Hang in there!!!

## 2013-12-28 NOTE — Assessment & Plan Note (Signed)
Pt has only lost 2 lbs on Belviq but admits to eating frequent fast food and drinking more regularly.  Again stressed the need for healthy diet and regular exercise.  Will continue to follow.

## 2013-12-28 NOTE — Progress Notes (Signed)
Pre visit review using our clinic review tool, if applicable. No additional management support is needed unless otherwise documented below in the visit note. 

## 2014-01-02 ENCOUNTER — Encounter: Payer: Self-pay | Admitting: General Practice

## 2014-01-10 ENCOUNTER — Telehealth: Payer: Self-pay | Admitting: *Deleted

## 2014-01-10 NOTE — Telephone Encounter (Signed)
Spoke with patient and advised patient to take a home pregnancy test.  She was also encouraged to call back with results and if symptoms worsen.

## 2014-01-10 NOTE — Telephone Encounter (Signed)
Patient called and left message on triage line stating that last month she got her weeks confused of when to change her NuvaRing. Patient states that now she feels very fatigued and exhausted every day. She would like to know if these symptoms are related to a hormonal imbalance or if she could be pregnant. CB#: [phone]

## 2014-02-02 ENCOUNTER — Telehealth: Payer: Self-pay

## 2014-02-02 NOTE — Telephone Encounter (Signed)
Received call from patient who states that she is having heavy spotting with menstrual type cramps. Notes indicate that in March she took her Nuvaring out a week to early. Patient states that it was February. Call was dated 01/10/2014. Advised to keep the ring in place and if symptoms persist she would need to be seen. If the symptoms worsen or bleeding increasing to seek emergency treatment. Patient states understanding.

## 2014-02-06 ENCOUNTER — Encounter: Payer: Self-pay | Admitting: General Practice

## 2014-02-06 NOTE — Progress Notes (Signed)
Letter mailed to pt to notify that labs were not performed at last visit as ordered. Pt advised to contact office to schedule appt.  

## 2014-02-09 ENCOUNTER — Ambulatory Visit: Payer: 59 | Admitting: Family Medicine

## 2014-02-12 ENCOUNTER — Other Ambulatory Visit: Payer: Self-pay | Admitting: Otolaryngology

## 2014-07-06 ENCOUNTER — Telehealth: Payer: Self-pay | Admitting: Family Medicine

## 2014-07-06 MED ORDER — ETONOGESTREL-ETHINYL ESTRADIOL 0.12-0.015 MG/24HR VA RING: VAGINAL_RING | VAGINAL | Status: DC

## 2014-07-06 NOTE — Telephone Encounter (Signed)
Med filled.  

## 2014-07-06 NOTE — Telephone Encounter (Signed)
Caller name: Andrian  Relation to pt: self  Call back number: [phone] Pharmacy: [facility] [phone] [facility]   Reason for call:   pt requesting a refill etonogestrel-ethinyl estradiol (NUVARING) 0.12-0.015 MG/24HR vaginal ring

## 2014-11-05 ENCOUNTER — Other Ambulatory Visit: Payer: Self-pay | Admitting: General Practice

## 2014-11-05 ENCOUNTER — Ambulatory Visit (INDEPENDENT_AMBULATORY_CARE_PROVIDER_SITE_OTHER): Payer: BLUE CROSS/BLUE SHIELD | Admitting: Family Medicine

## 2014-11-05 ENCOUNTER — Encounter: Payer: Self-pay | Admitting: Family Medicine

## 2014-11-05 VITALS — BP 120/78 | HR 80 | Temp 98.0°F | Resp 16 | Wt 178.5 lb

## 2014-11-05 DIAGNOSIS — R5383 Other fatigue: Secondary | ICD-10-CM

## 2014-11-05 DIAGNOSIS — K219 Gastro-esophageal reflux disease without esophagitis: Secondary | ICD-10-CM

## 2014-11-05 HISTORY — DX: Gastro-esophageal reflux disease without esophagitis: K21.9

## 2014-11-05 HISTORY — DX: Other fatigue: R53.83

## 2014-11-05 LAB — CBC WITH DIFFERENTIAL/PLATELET
Basophils Absolute: 0 10*3/uL (ref 0.0–0.1)
Basophils Relative: 0.4 % (ref 0.0–3.0)
Eosinophils Absolute: 0.1 10*3/uL (ref 0.0–0.7)
Eosinophils Relative: 1.4 % (ref 0.0–5.0)
HCT: 37.5 % (ref 36.0–46.0)
Hemoglobin: 12.2 g/dL (ref 12.0–15.0)
Lymphocytes Relative: 34.5 % (ref 12.0–46.0)
Lymphs Abs: 2.6 10*3/uL (ref 0.7–4.0)
MCHC: 32.5 g/dL (ref 30.0–36.0)
MCV: 83.7 fl (ref 78.0–100.0)
Monocytes Absolute: 0.5 10*3/uL (ref 0.1–1.0)
Monocytes Relative: 6.9 % (ref 3.0–12.0)
Neutro Abs: 4.3 10*3/uL (ref 1.4–7.7)
Neutrophils Relative %: 56.8 % (ref 43.0–77.0)
Platelets: 281 10*3/uL (ref 150.0–400.0)
RBC: 4.48 Mil/uL (ref 3.87–5.11)
RDW: 12.8 % (ref 11.5–15.5)
WBC: 7.5 10*3/uL (ref 4.0–10.5)

## 2014-11-05 LAB — BASIC METABOLIC PANEL
BUN: 10 mg/dL (ref 6–23)
CALCIUM: 9.4 mg/dL (ref 8.4–10.5)
CO2: 22 meq/L (ref 19–32)
CREATININE: 0.7 mg/dL (ref 0.4–1.2)
Chloride: 108 mEq/L (ref 96–112)
GFR: 127.16 mL/min (ref >60.00)
Glucose, Bld: 83 mg/dL (ref 70–99)
POTASSIUM: 4.2 meq/L (ref 3.5–5.1)
Sodium: 141 mEq/L (ref 135–145)

## 2014-11-05 LAB — IBC PANEL
Iron: 91 ug/dL (ref 42–145)
Saturation Ratios: 21.6 % (ref 20.0–50.0)
Transferrin: 300.8 mg/dL (ref 212.0–360.0)

## 2014-11-05 LAB — VITAMIN D 25 HYDROXY (VIT D DEFICIENCY, FRACTURES): VITD: 21.91 ng/mL — ABNORMAL LOW (ref 30.00–100.00)

## 2014-11-05 LAB — B12 AND FOLATE PANEL
Folate: 19.4 ng/mL (ref >5.9)
Vitamin B-12: 465 pg/mL (ref 211–911)

## 2014-11-05 LAB — TSH: TSH: 0.84 u[IU]/mL (ref 0.35–4.50)

## 2014-11-05 MED ORDER — OMEPRAZOLE 20 MG PO CPDR: 20.0000 mg | DELAYED_RELEASE_CAPSULE | Freq: Every day | ORAL | Status: DC

## 2014-11-05 MED ORDER — VITAMIN D (ERGOCALCIFEROL) 1.25 MG (50000 UNIT) PO CAPS: 50000.0000 [IU] | ORAL_CAPSULE | ORAL | Status: DC

## 2014-11-05 NOTE — Progress Notes (Signed)
Pre visit review using our clinic review tool, if applicable. No additional management support is needed unless otherwise documented below in the visit note. 

## 2014-11-05 NOTE — Progress Notes (Signed)
   Subjective:    Patient ID: Janice Raymond, female    DOB: 1986-04-18, 29 y.o.   MRN: [id number]  HPI Fatigue- pt has hx of 'low iron', 'i'm always cold and tired'.  Denies increased stress.  Hair is now brittle and hard to grow.  Pt reports she having difficulty losing weight.  Increased GERD.  No CP, SOB.  No abd pain, N/V.  + gas and bloating.  Denies headaches, fevers, chills.   Review of Systems For ROS see HPI     Objective:   Physical Exam  Constitutional: She is oriented to person, place, and time. She appears well-developed and well-nourished. No distress.  obese  HENT:  Head: Normocephalic and atraumatic.  Eyes: Conjunctivae and EOM are normal. Pupils are equal, round, and reactive to light.  Neck: Normal range of motion. Neck supple. No thyromegaly present.  Cardiovascular: Normal rate, regular rhythm, normal heart sounds and intact distal pulses.   No murmur heard. Pulmonary/Chest: Effort normal and breath sounds normal. No respiratory distress.  Abdominal: Soft. She exhibits no distension. There is no tenderness.  Musculoskeletal: She exhibits no edema.  Lymphadenopathy:    She has no cervical adenopathy.  Neurological: She is alert and oriented to person, place, and time.  Skin: Skin is warm and dry.  Psychiatric: She has a normal mood and affect. Her behavior is normal.  Vitals reviewed.         Assessment & Plan:

## 2014-11-05 NOTE — Assessment & Plan Note (Signed)
New.  Pt admits to poor dietary choices and lack of exercise.  This may be playing a role but will check labs to r/o anemia, thyroid abnormality, vitamin deficiency, electrolyte abnormality.  Encouraged regular exercise, healthy food choices and adequate rest.  Will follow.

## 2014-11-05 NOTE — Patient Instructions (Signed)
We'll notify you of your lab results and make any changes if needed Try and make healthy food choices and get regular exercise Start the Omeprazole daily to decrease acid production and reduce reflux and gas Call with any questions or concerns Happy New Year!!

## 2014-11-05 NOTE — Assessment & Plan Note (Signed)
New.  Pt having increased GERD, gas and bloating.  Start low dose PPI.  Reviewed dietary and lifestyle modifications.  Will follow.

## 2014-12-17 ENCOUNTER — Telehealth: Payer: Self-pay | Admitting: Family Medicine

## 2014-12-17 NOTE — Telephone Encounter (Signed)
Caller name: Janice Raymond Relation to pt: self Call back number: [phone] Pharmacy:  Reason for call:   Patient states that she was having intercourse and "lost" her nuvaring and wants to know what she should do about this. She isn't sure if she lost it during intercourse and at another time.

## 2014-12-17 NOTE — Telephone Encounter (Signed)
Called pt and LMOVM to return call.  °

## 2014-12-17 NOTE — Telephone Encounter (Signed)
Since pt is not clear on when she lost the Nuvaring, she needs to leave the ring out and allow herself to have a period this week.  She should then insert the new NuvaRing this Sunday.  If there is a chance of pregnancy, she may want to take Plan B birth control or come in for a pregnancy test (she should not insert new ring if no bleeding occurs due to possible pregnancy)

## 2014-12-18 NOTE — Telephone Encounter (Signed)
Pt.notified

## 2014-12-18 NOTE — Telephone Encounter (Signed)
Patient returned phone call. [facility] # [phone]

## 2014-12-18 NOTE — Telephone Encounter (Signed)
Called pt again and LMOVM to return call.  

## 2014-12-26 ENCOUNTER — Other Ambulatory Visit: Payer: Self-pay | Admitting: Family Medicine

## 2014-12-26 ENCOUNTER — Other Ambulatory Visit: Payer: Self-pay | Admitting: General Practice

## 2014-12-26 MED ORDER — VITAMIN D (ERGOCALCIFEROL) 1.25 MG (50000 UNIT) PO CAPS: 50000.0000 [IU] | ORAL_CAPSULE | ORAL | Status: DC

## 2015-01-28 ENCOUNTER — Telehealth: Payer: Self-pay | Admitting: Family Medicine

## 2015-01-28 ENCOUNTER — Other Ambulatory Visit: Payer: Self-pay | Admitting: General Practice

## 2015-01-28 MED ORDER — ETONOGESTREL-ETHINYL ESTRADIOL 0.12-0.015 MG/24HR VA RING: VAGINAL_RING | VAGINAL | Status: DC

## 2015-01-28 MED ORDER — OMEPRAZOLE 20 MG PO CPDR: 20.0000 mg | DELAYED_RELEASE_CAPSULE | Freq: Every day | ORAL | Status: DC

## 2015-01-28 NOTE — Telephone Encounter (Signed)
Med filled.  

## 2015-01-28 NOTE — Telephone Encounter (Signed)
°  Relation to pt: SELF  Call back number: [phone] Pharmacy: [facility]  Reason for call:  Pt requesting a refill etonogestrel-ethinyl estradiol (NUVARING) 0.12-0.015 MG/24HR vaginal ring please send to [facility]

## 2015-04-02 ENCOUNTER — Ambulatory Visit: Payer: BLUE CROSS/BLUE SHIELD | Admitting: Family Medicine

## 2015-04-03 ENCOUNTER — Encounter: Payer: Self-pay | Admitting: Family Medicine

## 2015-04-03 ENCOUNTER — Other Ambulatory Visit (INDEPENDENT_AMBULATORY_CARE_PROVIDER_SITE_OTHER): Payer: BLUE CROSS/BLUE SHIELD | Admitting: Family Medicine

## 2015-04-03 ENCOUNTER — Ambulatory Visit (INDEPENDENT_AMBULATORY_CARE_PROVIDER_SITE_OTHER): Payer: BLUE CROSS/BLUE SHIELD | Admitting: Family Medicine

## 2015-04-03 VITALS — BP 108/80 | HR 103 | Temp 98.9°F | Ht 65.75 in | Wt 167.6 lb

## 2015-04-03 DIAGNOSIS — R102 Pelvic and perineal pain unspecified side: Secondary | ICD-10-CM | POA: Insufficient documentation

## 2015-04-03 DIAGNOSIS — R109 Unspecified abdominal pain: Secondary | ICD-10-CM | POA: Diagnosis not present

## 2015-04-03 DIAGNOSIS — N9489 Other specified conditions associated with female genital organs and menstrual cycle: Secondary | ICD-10-CM | POA: Diagnosis not present

## 2015-04-03 DIAGNOSIS — R1030 Lower abdominal pain, unspecified: Secondary | ICD-10-CM | POA: Diagnosis not present

## 2015-04-03 LAB — POCT URINALYSIS DIPSTICK
Bilirubin, UA: NEGATIVE
Blood, UA: NEGATIVE
Glucose, UA: NEGATIVE
LEUKOCYTES UA: NEGATIVE
Nitrite, UA: NEGATIVE
Protein, UA: NEGATIVE
SPEC GRAV UA: 1.025
Urobilinogen, UA: 0.2
pH, UA: 6

## 2015-04-03 NOTE — Assessment & Plan Note (Signed)
New.  No pain today on exam.  No evidence of UTI.  Pt is not able to relate sxs to eating or BMs or any particular pattern.  Will check labs to assess for infxn.  Pt recently treated for BV but no current vaginal complaints.  If labs normal, will need pelvic US and/or pelvic exam.  Discussed that this may be colonic spasm and may improve w/ bentyl.  If labs normal, will attempt prn use to see if sxs improve.  Reviewed supportive care and red flags that should prompt return.  Pt expressed understanding and is in agreement w/ plan.

## 2015-04-03 NOTE — Patient Instructions (Signed)
Follow up as needed We'll notify you of your lab results and determine the next steps Your urine is clear- no evidence of infection If the labs look good, we'll do a pelvic ultrasound Try and log your symptoms- when they occur, how long they last, if it's associated w/ eating, bowel movements, etc Call with any questions or concerns Hang in there!

## 2015-04-03 NOTE — Progress Notes (Signed)
Pre visit review using our clinic review tool, if applicable. No additional management support is needed unless otherwise documented below in the visit note. 

## 2015-04-03 NOTE — Progress Notes (Signed)
   Subjective:    Patient ID: Eather Colasdrian Bocek, female    DOB: [birth year], 30 y.o.   MRN: [id number]  HPI Lower abd pressure- sxs started 1-2 months ago.  Pressure is intermittent.  Pt was seen in late May for BV.  No burning w/ urination.  No vaginal d/c.  Pain is not related to eating, BMs.  No N/V.  No fevers.   Review of Systems For ROS see HPI     Objective:   Physical Exam  Constitutional: She is oriented to person, place, and time. She appears well-developed and well-nourished. No distress.  HENT:  Head: Normocephalic and atraumatic.  Cardiovascular: Normal rate, regular rhythm and normal heart sounds.   Abdominal: Soft. Bowel sounds are normal. She exhibits no distension. There is no tenderness. There is no rebound and no guarding.  Musculoskeletal: She exhibits no edema.  Neurological: She is alert and oriented to person, place, and time.  Skin: Skin is warm and dry.  Psychiatric: She has a normal mood and affect. Her behavior is normal. Thought content normal.  Vitals reviewed.         Assessment & Plan:

## 2015-04-04 ENCOUNTER — Other Ambulatory Visit: Payer: Self-pay | Admitting: General Practice

## 2015-04-04 LAB — HEPATIC FUNCTION PANEL
ALBUMIN: 4.1 g/dL (ref 3.5–5.2)
ALK PHOS: 73 U/L (ref 39–117)
ALT: 18 U/L (ref 0–35)
AST: 15 U/L (ref 0–37)
BILIRUBIN DIRECT: 0.1 mg/dL (ref 0.0–0.3)
BILIRUBIN TOTAL: 0.5 mg/dL (ref 0.2–1.2)
Total Protein: 7.5 g/dL (ref 6.0–8.3)

## 2015-04-04 LAB — CBC WITH DIFFERENTIAL/PLATELET
BASOS ABS: 0 10*3/uL (ref 0.0–0.1)
Basophils Relative: 0.3 % (ref 0.0–3.0)
EOS ABS: 0.1 10*3/uL (ref 0.0–0.7)
Eosinophils Relative: 1.1 % (ref 0.0–5.0)
HCT: 38.7 % (ref 36.0–46.0)
Hemoglobin: 12.7 g/dL (ref 12.0–15.0)
LYMPHS PCT: 27.7 % (ref 12.0–46.0)
Lymphs Abs: 2.5 10*3/uL (ref 0.7–4.0)
MCHC: 32.8 g/dL (ref 30.0–36.0)
MCV: 83.5 fl (ref 78.0–100.0)
Monocytes Absolute: 0.5 10*3/uL (ref 0.1–1.0)
Monocytes Relative: 5.6 % (ref 3.0–12.0)
Neutro Abs: 5.9 10*3/uL (ref 1.4–7.7)
Neutrophils Relative %: 65.3 % (ref 43.0–77.0)
Platelets: 273 10*3/uL (ref 150.0–400.0)
RBC: 4.64 Mil/uL (ref 3.87–5.11)
RDW: 12.4 % (ref 11.5–15.5)
WBC: 9 10*3/uL (ref 4.0–10.5)

## 2015-04-04 LAB — BASIC METABOLIC PANEL
BUN: 10 mg/dL (ref 6–23)
CO2: 26 mEq/L (ref 19–32)
Calcium: 9.6 mg/dL (ref 8.4–10.5)
Chloride: 104 mEq/L (ref 96–112)
Creatinine, Ser: 0.62 mg/dL (ref 0.40–1.20)
GFR: 145.85 mL/min (ref >60.00)
Glucose, Bld: 80 mg/dL (ref 70–99)
Potassium: 4.3 mEq/L (ref 3.5–5.1)
SODIUM: 137 meq/L (ref 135–145)

## 2015-04-04 MED ORDER — DICYCLOMINE HCL 20 MG PO TABS: 20.0000 mg | ORAL_TABLET | Freq: Three times a day (TID) | ORAL | Status: DC | PRN

## 2015-04-05 ENCOUNTER — Other Ambulatory Visit: Payer: Self-pay | Admitting: Family Medicine

## 2015-04-05 NOTE — Telephone Encounter (Signed)
Rx sent to the pharmacy by e-script.//AB/CMA 

## 2015-04-08 ENCOUNTER — Telehealth: Payer: Self-pay | Admitting: General Practice

## 2015-04-08 NOTE — Telephone Encounter (Signed)
Agree w/ advice given 

## 2015-04-08 NOTE — Telephone Encounter (Signed)
Spoke with pt today and she had concerns regarding her recent appt for abdominal pain. Pt advised that before memorial day she had been seen in the UC in [place] for BV. Pt states that when she took out the nuvarng today that she had some discolored discharge this morning. This is the same ring as from when she had BV. Pt states that she is due to start period this week. Pt was advised that after cycle ends if she is still having any symptoms, abd pain, discharge that she needs an appt for evaluation.

## 2015-05-08 ENCOUNTER — Telehealth: Payer: Self-pay | Admitting: Family Medicine

## 2015-05-08 NOTE — Telephone Encounter (Signed)
Patient Name: Janice ColasDRIAN Clemons DOB: February 04, 1986 Initial Comment Caller states the patient has an insect bite and wondering what to put on it. Nurse Assessment Nurse: Elijah Birkaldwell, RN, Lynda Date/Time (Eastern Time): 05/08/2015 1:12:30 PM Confirm and document reason for call. If symptomatic, describe symptoms. ---Caller states she has an insect (black small bug with wings) bite on left arm on inside by elbow and wondering what to put on it. There is a red ring around bump & slight swelling at area. has used Calamine & Benadryl for itching. Was bitten on Sunday. Has the patient traveled out of the country within the last 30 days? ---Not Applicable Does the patient require triage? ---Yes Related visit to physician within the last 2 weeks? ---No Does the PT have any chronic conditions? (i.e. diabetes, asthma, etc.) ---Yes List chronic conditions. ---acid reflux Did the patient indicate they were pregnant? ---No Guidelines Guideline Title Affirmed Question Affirmed Notes Insect Bite All other insect bites (all triage questions negative) Final Disposition User [facility] [place], RN, ZOXWRLynda Disagree/Comply: Comply

## 2015-05-08 NOTE — Telephone Encounter (Signed)
Noted  

## 2015-07-22 ENCOUNTER — Ambulatory Visit (INDEPENDENT_AMBULATORY_CARE_PROVIDER_SITE_OTHER): Payer: BLUE CROSS/BLUE SHIELD | Admitting: Family

## 2015-07-22 ENCOUNTER — Encounter: Payer: Self-pay | Admitting: Family

## 2015-07-22 ENCOUNTER — Other Ambulatory Visit (HOSPITAL_COMMUNITY)
Admission: RE | Admit: 2015-07-22 | Discharge: 2015-07-22 | Disposition: A | Discharge status: Home / Self Care | Payer: BLUE CROSS/BLUE SHIELD | Source: Ambulatory Visit | Attending: Family | Admitting: Family

## 2015-07-22 ENCOUNTER — Telehealth: Payer: Self-pay | Admitting: Family Medicine

## 2015-07-22 VITALS — BP 116/84 | HR 86 | Temp 98.0°F | Resp 16 | Ht 65.75 in | Wt 165.0 lb

## 2015-07-22 DIAGNOSIS — L290 Pruritus ani: Secondary | ICD-10-CM | POA: Diagnosis not present

## 2015-07-22 DIAGNOSIS — Z309 Encounter for contraceptive management, unspecified: Secondary | ICD-10-CM | POA: Diagnosis not present

## 2015-07-22 DIAGNOSIS — R599 Enlarged lymph nodes, unspecified: Secondary | ICD-10-CM | POA: Diagnosis not present

## 2015-07-22 DIAGNOSIS — L298 Other pruritus: Secondary | ICD-10-CM

## 2015-07-22 DIAGNOSIS — Z113 Encounter for screening for infections with a predominantly sexual mode of transmission: Secondary | ICD-10-CM | POA: Diagnosis present

## 2015-07-22 DIAGNOSIS — R590 Localized enlarged lymph nodes: Secondary | ICD-10-CM

## 2015-07-22 DIAGNOSIS — N760 Acute vaginitis: Secondary | ICD-10-CM | POA: Diagnosis present

## 2015-07-22 DIAGNOSIS — N898 Other specified noninflammatory disorders of vagina: Secondary | ICD-10-CM

## 2015-07-22 MED ORDER — FLUCONAZOLE 150 MG PO TABS: 150.0000 mg | ORAL_TABLET | Freq: Once | ORAL | Status: DC

## 2015-07-22 MED ORDER — VALACYCLOVIR HCL 500 MG PO TABS: 500.0000 mg | ORAL_TABLET | Freq: Two times a day (BID) | ORAL | Status: DC

## 2015-07-22 NOTE — Telephone Encounter (Signed)
Faxed cancellation notice to [facility] and [facility]. Left message on voicemail re: correction and to call if any questions.

## 2015-07-22 NOTE — Progress Notes (Signed)
Subjective:    Patient ID: Janice Raymond, female    DOB: 1986/03/27, 29 y.o.   MRN: [id number]  HPI  Janice Raymond is a 29 yr odl female who presents today with chief complaint of anal itching. Reports that she developed anal itching/swelling on Friday 07/19/15.  Reports that she has had this in the past.  She has used preparation H but this caused vaginal irritiation.    Contraceptive management-  Switched from nuvaring to the patch.  Reports that she developed more vaginal vaginal.  Reports taht she had brast pain, cramping on the patch. Her GYN is [facility].  She is not currently using birth control.  LMP. Bled after she removed the patch on 07/10/15.    GERD- notes that she has recent groin pain/tenderness.  She did move an old TV out of the house.     Review of Systems See HPI  No past medical history on file.  Social History   Social History  . Marital Status: Married    Spouse Name: N/A  . Number of Children: N/A  . Years of Education: N/A   Occupational History  . Not on file.   Social History Main Topics  . Smoking status: Never Smoker   . Smokeless tobacco: Not on file  . Alcohol Use: Not on file  . Drug Use: Not on file  . Sexual Activity: Yes    Birth Control/ Protection: Inserts     Comment: NuvaRing   Other Topics Concern  . Not on file   Social History Narrative    No past surgical history on file.  No family history on file.  No Known Allergies  Current Outpatient Prescriptions on File Prior to Visit  Medication Sig Dispense Refill  . acyclovir (ZOVIRAX) 400 MG tablet     . dicyclomine (BENTYL) 20 MG tablet Take 1 tablet (20 mg total) by mouth 3 (three) times daily as needed for spasms. 45 tablet 0  . etonogestrel-ethinyl estradiol (NUVARING) 0.12-0.015 MG/24HR vaginal ring Insert vaginally and leave in place for 3 consecutive weeks, then remove for 1 week. 3 each 1  . omeprazole (PRILOSEC) 20 MG capsule TAKE 1 CAPSULE DAILY 90  capsule 1  . Vitamin D, Ergocalciferol, (DRISDOL) 50000 UNITS CAPS capsule Take 1 capsule (50,000 Units total) by mouth every 7 (seven) days. (Patient not taking: Reported on 04/03/2015) 4 capsule 1   No current facility-administered medications on file prior to visit.    BP 116/84 mmHg  Pulse 86  Temp(Src) 98 F (36.7 C) (Oral)  Resp 16  Ht 5' 5.75" (1.67 m)  Wt 165 lb (74.844 kg)  BMI 26.84 kg/m2  SpO2 100%  LMP 07/12/2015       Objective:   Physical Exam  Constitutional: She appears well-developed and well-nourished.  Cardiovascular: Normal rate, regular rhythm and normal heart sounds.   No murmur heard. Pulmonary/Chest: Effort normal and breath sounds normal. No respiratory distress. She has no wheezes.  Genitourinary:  External vulva normal Anus is normal except some mild hypopigmentation. No lesions.   Lymphadenopathy:       Right: Inguinal adenopathy present.       Left: No inguinal adenopathy present.  R inguinal lymph node is tender  Psychiatric: She has a normal mood and affect. Her behavior is normal. Judgment and thought content normal.          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 ? If she is  at the beginning of an outbreak which is cause for anal discomfort and enlarged LN. Will rx with valtrex and diflucan (to cover for possible yeast).  Check urine for HCG, GC/Chlamydia, trich, BV, Yeast.

## 2015-07-22 NOTE — Patient Instructions (Addendum)
Start valtrex today, start diflucan today. You can apply OTC hydrococortisone to external anal area as needed for itching. Call if symptoms worsen or do not improve.  Contact your GYN to discuss alternative birth control methods.  Mirena might be a good option for you.   Work on GERD diet to see if this helps with your reflux symptoms.  Let us know if your symptoms do not improve.  Food Choices for Gastroesophageal Reflux Disease When you have gastroesophageal reflux disease (GERD), the foods you eat and your eating habits are very important. Choosing the right foods can help ease the discomfort of GERD. WHAT GENERAL GUIDELINES DO I NEED TO FOLLOW?  Choose fruits, vegetables, whole grains, low-fat dairy products, and low-fat meat, fish, and poultry.  Limit fats such as oils, salad dressings, butter, nuts, and avocado.  Keep a food diary to identify foods that cause symptoms.  Avoid foods that cause reflux. These may be different for different people.  Eat frequent small meals instead of three large meals each day.  Eat your meals slowly, in a relaxed setting.  Limit fried foods.  Cook foods using methods other than frying.  Avoid drinking alcohol.  Avoid drinking large amounts of liquids with your meals.  Avoid bending over or lying down until 2-3 hours after eating. WHAT FOODS ARE NOT RECOMMENDED? The following are some foods and drinks that may worsen your symptoms: Vegetables Tomatoes. Tomato juice. Tomato and spaghetti sauce. Chili peppers. Onion and garlic. Horseradish. Fruits Oranges, grapefruit, and lemon (fruit and juice). Meats High-fat meats, fish, and poultry. This includes hot dogs, ribs, ham, sausage, salami, and bacon. Dairy Whole milk and chocolate milk. Sour cream. Cream. Butter. Ice cream. Cream cheese.  Beverages Coffee and tea, with or without caffeine. Carbonated beverages or energy drinks. Condiments Hot sauce. Barbecue sauce.   Sweets/Desserts Chocolate and cocoa. Donuts. Peppermint and spearmint. Fats and Oils High-fat foods, including [place] fries and potato chips. Other Vinegar. Strong spices, such as black pepper, white pepper, red pepper, cayenne, curry powder, cloves, ginger, and chili powder. The items listed above may not be a complete list of foods and beverages to avoid. Contact your dietitian for more information. Document Released: 10/12/2005 Document Revised: 10/17/2013 Document Reviewed: 08/16/2013 [facility] Patient Information 2015 [place], [place]. This information is not intended to replace advice given to you by your health care provider. Make sure you discuss any questions you have with your health care provider.

## 2015-07-22 NOTE — Progress Notes (Signed)
Pre visit review using our clinic review tool, if applicable. No additional management support is needed unless otherwise documented below in the visit note. 

## 2015-07-22 NOTE — Telephone Encounter (Signed)
Pharmacy: [id number] - [place], [place] - [address]  Reason for call: Please resend med RXs [facility] and cancel at [facility].

## 2015-07-24 LAB — URINE CYTOLOGY ANCILLARY ONLY
Chlamydia: NEGATIVE
Neisseria Gonorrhea: NEGATIVE
TRICH (WINDOWPATH): NEGATIVE

## 2015-07-25 ENCOUNTER — Encounter: Payer: Self-pay | Admitting: Family

## 2015-07-25 LAB — URINE CYTOLOGY ANCILLARY ONLY
BACTERIAL VAGINITIS: NEGATIVE
Candida vaginitis: NEGATIVE

## 2015-07-29 ENCOUNTER — Telehealth: Payer: Self-pay | Admitting: Family Medicine

## 2015-07-29 NOTE — Telephone Encounter (Signed)
Pt has questions about what happens to your body when you stop taking birth control. Had patch until 07/12/15. Ordered nuvaring last visit. She wants to know what happens to her body in between bc she is still having stomach pains. Saw Melissa 07/22/15.

## 2015-07-29 NOTE — Telephone Encounter (Signed)
She may be having ovulatory pains since she is no longer on birth control. If pain is severe, she should be evaluated in office.

## 2015-07-29 NOTE — Telephone Encounter (Signed)
Per verbal from PCP, needs more info about location and type of abdominal pain. Left message for pt to return my call.

## 2015-07-30 ENCOUNTER — Ambulatory Visit (INDEPENDENT_AMBULATORY_CARE_PROVIDER_SITE_OTHER): Payer: BLUE CROSS/BLUE SHIELD | Admitting: Family

## 2015-07-30 ENCOUNTER — Ambulatory Visit (HOSPITAL_BASED_OUTPATIENT_CLINIC_OR_DEPARTMENT_OTHER)
Admission: RE | Admit: 2015-07-30 | Discharge: 2015-07-30 | Disposition: A | Discharge status: Home / Self Care | Payer: BLUE CROSS/BLUE SHIELD | Source: Ambulatory Visit | Attending: Family | Admitting: Family

## 2015-07-30 ENCOUNTER — Encounter: Payer: Self-pay | Admitting: Family

## 2015-07-30 VITALS — BP 116/83 | HR 84 | Temp 98.1°F | Resp 18 | Ht 65.75 in | Wt 166.4 lb

## 2015-07-30 DIAGNOSIS — M25552 Pain in left hip: Secondary | ICD-10-CM

## 2015-07-30 DIAGNOSIS — R1032 Left lower quadrant pain: Secondary | ICD-10-CM

## 2015-07-30 DIAGNOSIS — R102 Pelvic and perineal pain: Secondary | ICD-10-CM | POA: Diagnosis present

## 2015-07-30 LAB — POCT URINALYSIS DIPSTICK
Bilirubin, UA: NEGATIVE
Blood, UA: NEGATIVE
Glucose, UA: NEGATIVE
Ketones, UA: NEGATIVE
Leukocytes, UA: NEGATIVE
Nitrite, UA: NEGATIVE
Spec Grav, UA: 1.015
Urobilinogen, UA: NEGATIVE
pH, UA: 7

## 2015-07-30 LAB — POCT URINE HCG BY VISUAL COLOR COMPARISON TESTS: Preg Test, Ur: NEGATIVE

## 2015-07-30 NOTE — Progress Notes (Signed)
Pre visit review using our clinic review tool, if applicable. No additional management support is needed unless otherwise documented below in the visit note. 

## 2015-07-30 NOTE — Telephone Encounter (Signed)
Spoke with pt. States she removed birth control patch on 9/16. Had bleeding the week after and states she would have been due for her regular cycle at that time and was unsure if this was normal cycle or bleeding because of stopping patch.  Also states pain started this past Friday has been constant since Saturday. Notes pain is in the LLQ and she feels tender to touch. Denies n/v, fever. Scheduled app for today with PCP at 1pm.

## 2015-07-30 NOTE — Progress Notes (Signed)
   Subjective:    Patient ID: Janice Raymond, female    DOB: 08-17-1986, 29 y.o.   MRN: [id number]  HPI  Janice Raymond is a 29 yr old female who presents today with chief complaint of LLQ pain.  Pain has been present x since thurs/friday of last week.   She was seen last week and reported that she removed her birth control patch on 07/10/15.  She had right groin pain/inguinal LAD at that time.  She was treated empirically for possible HSV and vaginal candidiasis. Symptoms resolved.  Pt reports pain comes/goes, worse at night when she is laying down. Tried some excedrin which did not hurt. Denies dysuria, hematuria, fever, nausea/vomitting, diarrhea.  Reports pain is to worsened by movement. She reports pain is 5-6/10.  She does not wish to restart birth control. She is interested in trying to become pregnant.   Review of Systems See HPI  No past medical history on file.  Social History   Social History  . Marital Status: Married    Spouse Name: N/A  . Number of Children: N/A  . Years of Education: N/A   Occupational History  . Not on file.   Social History Main Topics  . Smoking status: Never Smoker   . Smokeless tobacco: Not on file  . Alcohol Use: Not on file  . Drug Use: Not on file  . Sexual Activity: Yes    Birth Control/ Protection: Inserts     Comment: NuvaRing   Other Topics Concern  . Not on file   Social History Narrative    No past surgical history on file.  No family history on file.  No Known Allergies  Current Outpatient Prescriptions on File Prior to Visit  Medication Sig Dispense Refill  . dicyclomine (BENTYL) 20 MG tablet Take 1 tablet (20 mg total) by mouth 3 (three) times daily as needed for spasms. 45 tablet 0  . omeprazole (PRILOSEC) 20 MG capsule TAKE 1 CAPSULE DAILY 90 capsule 1  . valACYclovir (VALTREX) 500 MG tablet Take 1 tablet (500 mg total) by mouth 2 (two) times daily. For 3 days 6 tablet 0  . Vitamin D, Ergocalciferol, (DRISDOL) 50000  UNITS CAPS capsule Take 1 capsule (50,000 Units total) by mouth every 7 (seven) days. 4 capsule 1   No current facility-administered medications on file prior to visit.    BP 116/83 mmHg  Pulse 84  Temp(Src) 98.1 F (36.7 C) (Oral)  Resp 18  Ht 5' 5.75" (1.67 m)  Wt 166 lb 6.4 oz (75.479 kg)  BMI 27.06 kg/m2  SpO2 99%  LMP 07/12/2015        Objective:   Physical Exam  Constitutional: She appears well-developed and well-nourished.  Cardiovascular: Normal rate, regular rhythm and normal heart sounds.   No murmur heard. Pulmonary/Chest: Effort normal and breath sounds normal. No respiratory distress. She has no wheezes.  Abdominal: Soft.  + LLQ tenderness to palpation without guarding, no rebound  Psychiatric: She has a normal mood and affect. Her behavior is normal. Judgment and thought content normal.          Assessment & Plan:  LLQ pain- Urine HCG is negative.  UA clean, suspect ovarian cyst.  Will refer for pelvic US. Advised pt to start a prenatal vitamin.

## 2015-07-30 NOTE — Patient Instructions (Addendum)
Add a prenatal vitamin once daily.  Complete your ultrasound on the first floor. Call if pain worsens or does not improve.

## 2015-07-31 ENCOUNTER — Telehealth: Payer: Self-pay | Admitting: Family

## 2015-07-31 NOTE — Telephone Encounter (Signed)
Please contact pt and let her know that her ultrasound is normal.  Please let me know if her abdominal pain worsens, if she develops fever, or if pain does not improve.  Pain could be musculoskeletal.

## 2015-07-31 NOTE — Telephone Encounter (Signed)
Notified pt and she voices understanding. Reports that pain feels better today.

## 2015-08-15 ENCOUNTER — Encounter: Payer: Self-pay | Admitting: Family

## 2015-08-15 ENCOUNTER — Telehealth: Payer: Self-pay | Admitting: Family

## 2015-08-15 MED ORDER — VALACYCLOVIR HCL 500 MG PO TABS: 500.0000 mg | ORAL_TABLET | Freq: Two times a day (BID) | ORAL | Status: DC

## 2015-08-15 NOTE — Telephone Encounter (Signed)
Medication filled to pharmacy as requested.   

## 2015-08-16 MED ORDER — ACYCLOVIR 400 MG PO TABS: 400.0000 mg | ORAL_TABLET | Freq: Two times a day (BID) | ORAL | Status: AC

## 2015-08-16 NOTE — Telephone Encounter (Signed)
Please advised I refilled pt valtrex yesterday and she wants to know if she can get acyclovir today instead since it is cheaper. Please advise on the dose?

## 2015-08-16 NOTE — Telephone Encounter (Signed)
i sent acyclovir since she said she didn't have insurance

## 2015-08-16 NOTE — Telephone Encounter (Signed)
Valtrex was sent  She needs acoclyvir due to cost

## 2015-10-02 ENCOUNTER — Other Ambulatory Visit: Payer: Self-pay | Admitting: Family Medicine

## 2015-10-02 NOTE — Telephone Encounter (Signed)
Medication filled to pharmacy as requested.   

## 2015-10-24 ENCOUNTER — Telehealth: Payer: Self-pay | Admitting: Family Medicine

## 2015-10-24 NOTE — Telephone Encounter (Signed)
Patient Name: Janice ColasDRIAN Bonenfant DOB: 1986/03/30 Initial Comment Caller states she has cold wants medication safe for pregnancy, around 2-3 weeks Nurse Assessment Nurse: Yetta BarreJones, RN, Miranda Date/Time (Eastern Time): 10/24/2015 12:28:48 PM Confirm and document reason for call. If symptomatic, describe symptoms. ---Caller states she is pregnant and having cold symptoms for the last 2 days. Has the patient traveled out of the country within the last 30 days? ---No Does the patient have any new or worsening symptoms? ---Yes Will a triage be completed? ---Yes Related visit to physician within the last 2 weeks? ---No Does the PT have any chronic conditions? (i.e. diabetes, asthma, etc.) ---No Did the patient indicate they were pregnant? ---Yes How many weeks gestation? ---~2-3 weeks Have you felt decreased fetal movement? ---Early Pregnancy - No Fetal Movement Felt Yet Is this a behavioral health or substance abuse call? ---No Guidelines Guideline Title Affirmed Question Affirmed Notes Common Cold Cold with no complications (all triage questions negative) Final Disposition User Home Care Yetta BarreJones, RN, Miranda Comments Told caller to check with OB office or pharmacy for OTC recommendations during pregnancy. Disagree/Comply: Comply

## 2015-10-27 NOTE — L&D Delivery Note (Signed)
Delivery Note At 6:26 PM a viable female was delivered via Vaginal, Spontaneous Delivery (Presentation: ; DOA ).  APGAR: 9, 9; weight  .   Placenta status: manual extraction due to cord avulsion, placenta appears complete but torn in multiple pieces, .  Cord: 3 VC, avulsed  with the following complications: manual placental extraction.  Cord pH: not indicated.  About 30 min prior to delivery, prolonged decel to low 100's x 18 min with adequate variability within. After that baseline up to 130s but matched maternal heart rate. Doppler adjusted and audibly was fetal FH but strangely mimicked maternal HR.   Anesthesia:   Episiotomy: None Lacerations: 2nd degree Suture Repair: 2.0 vicryl rapide Est. Blood Loss (mL): 300  Mom to postpartum.  Baby to Couplet care / Skin to Skin.  [place] A. 06/20/2016, 6:59 PM

## 2015-10-30 ENCOUNTER — Ambulatory Visit: Payer: Self-pay | Admitting: Family Medicine

## 2015-11-27 LAB — OB RESULTS CONSOLE GC/CHLAMYDIA
CHLAMYDIA, DNA PROBE: NEGATIVE
Gonorrhea: NEGATIVE

## 2015-11-27 LAB — OB RESULTS CONSOLE RPR: RPR: NONREACTIVE

## 2015-11-27 LAB — OB RESULTS CONSOLE HEPATITIS B SURFACE ANTIGEN: HEP B S AG: NEGATIVE

## 2015-11-27 LAB — OB RESULTS CONSOLE HIV ANTIBODY (ROUTINE TESTING): HIV: NONREACTIVE

## 2015-11-27 LAB — OB RESULTS CONSOLE ABO/RH: RH Type: POSITIVE

## 2015-11-27 LAB — OB RESULTS CONSOLE RUBELLA ANTIBODY, IGM: RUBELLA: IMMUNE

## 2015-11-27 LAB — OB RESULTS CONSOLE ANTIBODY SCREEN: Antibody Screen: NEGATIVE

## 2015-12-18 ENCOUNTER — Encounter: Payer: Self-pay | Admitting: Medical

## 2015-12-18 ENCOUNTER — Ambulatory Visit (INDEPENDENT_AMBULATORY_CARE_PROVIDER_SITE_OTHER): Payer: PRIVATE HEALTH INSURANCE | Admitting: Medical

## 2015-12-18 VITALS — BP 116/68 | HR 105 | Temp 98.4°F | Ht 65.75 in | Wt 169.4 lb

## 2015-12-18 DIAGNOSIS — M791 Myalgia: Secondary | ICD-10-CM

## 2015-12-18 DIAGNOSIS — J029 Acute pharyngitis, unspecified: Secondary | ICD-10-CM | POA: Diagnosis not present

## 2015-12-18 DIAGNOSIS — R509 Fever, unspecified: Secondary | ICD-10-CM | POA: Diagnosis not present

## 2015-12-18 DIAGNOSIS — M609 Myositis, unspecified: Secondary | ICD-10-CM

## 2015-12-18 DIAGNOSIS — IMO0001 Reserved for inherently not codable concepts without codable children: Secondary | ICD-10-CM

## 2015-12-18 MED ORDER — AZITHROMYCIN 250 MG PO TABS: ORAL_TABLET | ORAL | Status: DC

## 2015-12-18 NOTE — Progress Notes (Signed)
Subjective:    Patient ID: Janice Raymond, female    DOB: 1986/09/24, 30 y.o.   MRN: [id number]  HPI  Pt states she had body aches all over on Monday. Mild st. Chills, fever, and sweats. Monday night t-max was 100.0. Pt states now bodyaches subsided. Pt feels like sore throat mild- moderate irritated and itching presently.  But she also reports tonsills feels swollen and lymph node swollen.(She looks in mirrow and states much larger than baseline appearance)   Today pt has itching nose and itchy throat. Mild sneezing. Some cough since last Tuesday.   Pt is [redacted] weeks pregnant.   Pt OB Dr. Laurie Panda. [facility]. [phone].   Pt did not take flu vaccine this year.      Review of Systems  Constitutional: Negative for fever, chills and fatigue.  HENT: Positive for congestion and sore throat. Negative for drooling, ear pain, mouth sores, postnasal drip and sneezing.   Eyes: Positive for itching.  Respiratory: Negative for cough, shortness of breath and wheezing.   Cardiovascular: Negative for chest pain and palpitations.  Gastrointestinal: Negative for abdominal pain.  Genitourinary: Negative for dysuria, urgency, frequency, flank pain, enuresis, difficulty urinating, menstrual problem and pelvic pain.       No spotting. No vaginal bleeding. No abdomen pain.  Musculoskeletal: Positive for myalgias. Negative for back pain.  Neurological: Negative for dizziness and headaches.  Hematological: Positive for adenopathy. Does not bruise/bleed easily.  Psychiatric/Behavioral: Negative for behavioral problems and confusion.     No past medical history on file.  Social History   Social History  . Marital Status: Married    Spouse Name: N/A  . Number of Children: N/A  . Years of Education: N/A   Occupational History  . Not on file.   Social History Main Topics  . Smoking status: Never Smoker   . Smokeless tobacco: Not on file  . Alcohol Use: Not on file  . Drug Use: Not  on file  . Sexual Activity: Yes    Birth Control/ Protection: Inserts     Comment: NuvaRing   Other Topics Concern  . Not on file   Social History Narrative    No past surgical history on file.  No family history on file.  No Known Allergies  Current Outpatient Prescriptions on File Prior to Visit  Medication Sig Dispense Refill  . Vitamin D, Ergocalciferol, (DRISDOL) 50000 UNITS CAPS capsule Take 1 capsule (50,000 Units total) by mouth every 7 (seven) days. 4 capsule 1  . dicyclomine (BENTYL) 20 MG tablet Take 1 tablet (20 mg total) by mouth 3 (three) times daily as needed for spasms. (Patient not taking: Reported on 12/18/2015) 45 tablet 0  . omeprazole (PRILOSEC) 20 MG capsule TAKE 1 CAPSULE DAILY (Patient not taking: Reported on 12/18/2015) 90 capsule 1   No current facility-administered medications on file prior to visit.    BP 116/68 mmHg  Pulse 105  Temp(Src) 98.4 F (36.9 C) (Oral)  Ht 5' 5.75" (1.67 m)  Wt 169 lb 6.4 oz (76.839 kg)  BMI 27.55 kg/m2  SpO2 98%  LMP 07/12/2015       Objective:   Physical Exam  General  Mental Status - Alert. General Appearance - Well groomed. Not in acute distress.  Skin Rashes- No Rashes.  HEENT Head- Normal. Ear Auditory Canal - Left- Normal. Right - Normal.Tympanic Membrane- Left- Normal. Right- Normal. Eye Sclera/Conjunctiva- Left- Normal. Right- Normal. Nose & Sinuses Nasal Mucosa- Left-  Boggy and Congested.  Right-  Boggy and  Congested.Bilateral maxillary and frontal sinus pressure. Mouth & Throat Lips: Upper Lip- Normal: no dryness, cracking, pallor, cyanosis, or vesicular eruption. Lower Lip-Normal: no dryness, cracking, pallor, cyanosis or vesicular eruption. Buccal Mucosa- Bilateral- No Aphthous ulcers. Oropharynx- No Discharge but moderate  Erythema. Tonsils: Characteristics- Bilateral-  Moderate bright Erythema and  Congestion. Size/Enlargement- Bilateral- 2 +  enlargement. Discharge-  bilateral-None.  Neck Neck- Supple. No Masses.   Chest and Lung Exam Auscultation: Breath Sounds:-Clear even and unlabored.  Cardiovascular Auscultation:Rythm- Regular, rate and rhythm. Murmurs & Other Heart Sounds:Ausculatation of the heart reveal- No Murmurs.  Lymphatic Head & Neck General Head & Neck Lymphatics: Bilateral: Description- bilateral mild submandibular node  Lymphadenopathy. More left side and mild tender.  Abdomen- soft, nt, nd, +bs.  Back- no cva tenderness       Assessment & Plan:  Both flu and strep test were negative.  Clinically more suspicious for strep(maybe false neg rapid result). Based on presentation and fact you are pregnant will go ahead and give azithromycin category b med).  Will also follow the culture of your throat and see result. If + continue antibiotic. If negative assess how you are clinically. May stop antibiotic early if neg result and you are feeling better.  For other symptomatic meds follow guidelines of your OB doctor.  Follow up in 7 days if any persisting symptoms or as needed

## 2015-12-18 NOTE — Progress Notes (Signed)
Pre visit review using our clinic review tool, if applicable. No additional management support is needed unless otherwise documented below in the visit note. 

## 2015-12-18 NOTE — Patient Instructions (Addendum)
Both flu and strep test were negative.  Clinically more suspicious for strep(maybe false neg rapid result). Based on presentation and fact you are pregnant will go ahead and give azithromycin category b med).  Will also follow the culture of your throat and see result. If + continue antibiotic. If negative assess how you are clinically. May stop antibiotic early if neg result and you are feeling better.  For other symptomatic meds follow guidelines of your OB doctor.  Follow up in 7 days if any persisting symptoms or as needed

## 2015-12-18 NOTE — Addendum Note (Signed)
Addended by: Neldon Labella on: 12/18/2015 01:49 PM   Modules accepted: Orders

## 2015-12-20 LAB — CULTURE, GROUP A STREP: ORGANISM ID, BACTERIA: NORMAL

## 2016-02-24 LAB — HM PAP SMEAR: HM Pap smear: NORMAL

## 2016-03-21 ENCOUNTER — Inpatient Hospital Stay (HOSPITAL_COMMUNITY): Payer: No Typology Code available for payment source

## 2016-03-21 ENCOUNTER — Inpatient Hospital Stay (HOSPITAL_COMMUNITY)
Admission: AD | Admit: 2016-03-21 | Discharge: 2016-03-21 | Disposition: A | Discharge status: Home / Self Care | Payer: No Typology Code available for payment source | Source: Ambulatory Visit | Attending: Obstetrics & Gynecology | Admitting: Obstetrics & Gynecology

## 2016-03-21 ENCOUNTER — Encounter (HOSPITAL_COMMUNITY): Payer: Self-pay | Admitting: Certified Nurse Midwife

## 2016-03-21 DIAGNOSIS — O4693 Antepartum hemorrhage, unspecified, third trimester: Secondary | ICD-10-CM | POA: Diagnosis present

## 2016-03-21 DIAGNOSIS — N939 Abnormal uterine and vaginal bleeding, unspecified: Secondary | ICD-10-CM

## 2016-03-21 DIAGNOSIS — Z3A28 28 weeks gestation of pregnancy: Secondary | ICD-10-CM | POA: Diagnosis not present

## 2016-03-21 DIAGNOSIS — O4413 Placenta previa with hemorrhage, third trimester: Secondary | ICD-10-CM

## 2016-03-21 NOTE — Discharge Instructions (Signed)
Vaginal Bleeding During Pregnancy, Third Trimester °A small amount of bleeding (spotting) from the vagina is relatively common in pregnancy. Various things can cause bleeding or spotting in pregnancy. Sometimes the bleeding is normal and is not a problem. However, bleeding during the third trimester can also be a sign of something serious for the mother and the baby. Be sure to tell your health care provider about any vaginal bleeding right away.  °Some possible causes of vaginal bleeding during the third trimester include:  °· The placenta may be partially or completely covering the opening to the cervix (placenta previa).   °· The placenta may have separated from the uterus (abruption of the placenta).   °· There may be an infection or growth on the cervix.   °· You may be starting labor, called discharging of the mucus plug.   °· The placenta may grow into the muscle layer of the uterus (placenta accreta).   °HOME CARE INSTRUCTIONS  °Watch your condition for any changes. The following actions may help to lessen any discomfort you are feeling:  °· Follow your health care provider's instructions for limiting your activity. If your health care provider orders bed rest, you may need to stay in bed and only get up to use the bathroom. However, your health care provider may allow you to continue light activity. °· If needed, make plans for someone to help with your regular activities and responsibilities while you are on bed rest. °· Keep track of the number of pads you use each day, how often you change pads, and how soaked (saturated) they are. Write this down. °· Do not use tampons. Do not douche. °· Do not have sexual intercourse or orgasms until approved by your health care provider. °· Follow your health care provider's advice about lifting, driving, and physical activities. °· If you pass any tissue from your vagina, save the tissue so you can show it to your health care provider.   °· Only take over-the-counter  or prescription medicines as directed by your health care provider. °· Do not take aspirin because it can make you bleed.   °· Keep all follow-up appointments as directed by your health care provider. °SEEK MEDICAL CARE IF: °· You have any vaginal bleeding during any part of your pregnancy. °· You have cramps or labor pains. °· You have a fever, not controlled by medicine. °SEEK IMMEDIATE MEDICAL CARE IF:  °· You have severe cramps or pain in your back or belly (abdomen). °· You have chills. °· You have a gush of fluid from the vagina. °· You pass large clots or tissue from your vagina. °· Your bleeding increases. °· You feel light-headed or weak. °· You pass out. °· You feel less movement or no movement of the baby.   °MAKE SURE YOU: °· Understand these instructions. °· Will watch your condition. °· Will get help right away if you are not doing well or get worse. °  °This information is not intended to replace advice given to you by your health care provider. Make sure you discuss any questions you have with your health care provider. °  °Document Released: 01/02/2003 Document Revised: 10/17/2013 Document Reviewed: 06/19/2013 °Elsevier Interactive Patient Education ©2016 Elsevier Inc. ° °

## 2016-03-21 NOTE — MAU Note (Signed)
Pt states she had some light pink spotting this AM. Pt denies LOF or ctxs. Fetus active. Pt diagnosed with a partial previa at 19 weeks.

## 2016-03-21 NOTE — MAU Provider Note (Signed)
  History     CSN: [id number]  Arrival date and time: 03/21/16 1108   First Provider Initiated Contact with Patient 03/21/16 1214      Chief Complaint  Patient presents with  . Vaginal Bleeding   HPI    [name] is a 30 y.o. female G1P0 @ 5774w1d presenting to MAU with vaginal bleeding; she has a history of placenta previa, diagnosed @ 19 weeks.   She has been on pelvic rest  She woke up with morning and noted pink spotting, it was noticed in her underwear. She denies pain, she is feeling her baby move.   She was scheduled to have an [place] on June 12th to evaluate the previa.      OB History    Gravida Para Term Preterm AB TAB SAB Ectopic Multiple Living   1               History reviewed. No pertinent past medical history.  Past Surgical History  Procedure Laterality Date  . No past surgeries      History reviewed. No pertinent family history.  Social History  Substance Use Topics  . Smoking status: Never Smoker   . Smokeless tobacco: None  . Alcohol Use: No    Allergies: No Known Allergies  Prescriptions prior to admission  Medication Sig Dispense Refill Last Dose  . omeprazole (PRILOSEC) 20 MG capsule TAKE 1 CAPSULE DAILY 90 capsule 1 Past Month at Unknown time  . Prenatal Vit-Fe Fumarate-FA (PRENATAL VITAMIN PO) Take 1 capsule by mouth daily.    03/20/2016 at Unknown time   No results found for this or any previous visit (from the past 48 hour(s)).  Review of Systems  Constitutional: Negative for fever.  Gastrointestinal: Negative for nausea, vomiting, diarrhea and constipation.  Genitourinary: Negative for dysuria and urgency.   Physical Exam   Blood pressure 111/76, pulse 94, temperature 97.8 F (36.6 C), temperature source Oral, resp. rate 20, last menstrual period 07/12/2015.  Physical Exam  Constitutional: She is oriented to person, place, and time. She appears well-developed and well-nourished. No distress.  HENT:  Head:  Normocephalic.  Eyes: Pupils are equal, round, and reactive to light.  Neck: Neck supple.  Respiratory: Effort normal.  GI: Soft. She exhibits no distension. There is no tenderness. There is no rebound.  Genitourinary:  Speculum exam: Vagina - Small amount of creamy, pink/brown discharge, no odor Cervix - No contact bleeding, no active bleeding Bimanual exam: deferred  Chaperone present for exam.  Musculoskeletal: Normal range of motion.  Neurological: She is alert and oriented to person, place, and time.  Skin: Skin is warm. She is not diaphoretic.  Psychiatric: Her behavior is normal.   Fetal Tracing: Baseline: 130 bpm Variability: Moderate Accelerations: 15x15 Decelerations: none Toco: quiet   MAU Course  Procedures  None  MDM  B positive blood type  US to evaluate placenta location.  Discussed patient with Dr. Seymour BarsLavoie @ 1345  Assessment and Plan   A:  1. Vaginal bleeding in pregnancy, third trimester      2. Vaginal bleeding     P:  Discharge home in stable condition Bleeding precautions Return to MAU if symptoms worsen Pelvic rest Follow up with Dr. Seymour BarsLavoie as scheduled    Duane LopeJennifer I Rasch, NP 03/21/2016 12:18 PM

## 2016-05-11 LAB — OB RESULTS CONSOLE GBS: GBS: NEGATIVE

## 2016-06-20 ENCOUNTER — Inpatient Hospital Stay (HOSPITAL_COMMUNITY): Payer: No Typology Code available for payment source | Admitting: Anesthesiology

## 2016-06-20 ENCOUNTER — Inpatient Hospital Stay (HOSPITAL_COMMUNITY)
Admission: AD | Admit: 2016-06-20 | Discharge: 2016-06-22 | DRG: 767 | Disposition: A | Discharge status: Home / Self Care | Payer: No Typology Code available for payment source | Source: Ambulatory Visit | Attending: Obstetrics | Admitting: Obstetrics

## 2016-06-20 ENCOUNTER — Encounter (HOSPITAL_COMMUNITY): Payer: Self-pay | Admitting: *Deleted

## 2016-06-20 DIAGNOSIS — A6000 Herpesviral infection of urogenital system, unspecified: Secondary | ICD-10-CM | POA: Diagnosis present

## 2016-06-20 DIAGNOSIS — O9832 Other infections with a predominantly sexual mode of transmission complicating childbirth: Secondary | ICD-10-CM | POA: Diagnosis present

## 2016-06-20 DIAGNOSIS — Z3A41 41 weeks gestation of pregnancy: Secondary | ICD-10-CM | POA: Diagnosis not present

## 2016-06-20 DIAGNOSIS — D649 Anemia, unspecified: Secondary | ICD-10-CM | POA: Diagnosis present

## 2016-06-20 DIAGNOSIS — O9902 Anemia complicating childbirth: Secondary | ICD-10-CM | POA: Diagnosis present

## 2016-06-20 DIAGNOSIS — O480 Post-term pregnancy: Secondary | ICD-10-CM | POA: Diagnosis present

## 2016-06-20 DIAGNOSIS — IMO0001 Reserved for inherently not codable concepts without codable children: Secondary | ICD-10-CM

## 2016-06-20 LAB — ABO/RH: ABO/RH(D): B POS

## 2016-06-20 LAB — CBC
HEMATOCRIT: 34.1 % — AB (ref 36.0–46.0)
HEMOGLOBIN: 11.4 g/dL — AB (ref 12.0–15.0)
MCH: 28.6 pg (ref 26.0–34.0)
MCHC: 33.4 g/dL (ref 30.0–36.0)
MCV: 85.7 fL (ref 78.0–100.0)
Platelets: 224 10*3/uL (ref 150–400)
RBC: 3.98 MIL/uL (ref 3.87–5.11)
RDW: 14 % (ref 11.5–15.5)
WBC: 8.9 10*3/uL (ref 4.0–10.5)

## 2016-06-20 LAB — TYPE AND SCREEN
ABO/RH(D): B POS
Antibody Screen: NEGATIVE

## 2016-06-20 MED ORDER — OXYTOCIN BOLUS FROM INFUSION
500.0000 mL | Freq: Once | INTRAVENOUS | Status: AC
  Administered 2016-06-20: 500 mL via INTRAVENOUS

## 2016-06-20 MED ORDER — IBUPROFEN 600 MG PO TABS
600.0000 mg | ORAL_TABLET | Freq: Four times a day (QID) | ORAL | Status: DC
  Administered 2016-06-20 – 2016-06-22 (×7): 600 mg via ORAL
  Filled 2016-06-20 (×8): qty 1

## 2016-06-20 MED ORDER — OXYCODONE-ACETAMINOPHEN 5-325 MG PO TABS: 2.0000 | ORAL_TABLET | ORAL | Status: DC | PRN

## 2016-06-20 MED ORDER — SENNOSIDES-DOCUSATE SODIUM 8.6-50 MG PO TABS
2.0000 | ORAL_TABLET | ORAL | Status: DC
  Administered 2016-06-21 (×2): 2 via ORAL
  Filled 2016-06-20 (×2): qty 2

## 2016-06-20 MED ORDER — TETANUS-DIPHTH-ACELL PERTUSSIS 5-2.5-18.5 LF-MCG/0.5 IM SUSP: 0.5000 mL | Freq: Once | INTRAMUSCULAR | Status: DC

## 2016-06-20 MED ORDER — LACTATED RINGERS IV SOLN
500.0000 mL | Freq: Once | INTRAVENOUS | Status: AC
  Administered 2016-06-20: 500 mL via INTRAVENOUS

## 2016-06-20 MED ORDER — DIPHENHYDRAMINE HCL 50 MG/ML IJ SOLN: 12.5000 mg | INTRAMUSCULAR | Status: DC | PRN

## 2016-06-20 MED ORDER — FLEET ENEMA 7-19 GM/118ML RE ENEM: 1.0000 | ENEMA | RECTAL | Status: DC | PRN

## 2016-06-20 MED ORDER — WITCH HAZEL-GLYCERIN EX PADS: 1.0000 "application " | MEDICATED_PAD | CUTANEOUS | Status: DC | PRN

## 2016-06-20 MED ORDER — ACETAMINOPHEN 325 MG PO TABS: 650.0000 mg | ORAL_TABLET | ORAL | Status: DC | PRN

## 2016-06-20 MED ORDER — PRENATAL MULTIVITAMIN CH
1.0000 | ORAL_TABLET | Freq: Every day | ORAL | Status: DC
  Administered 2016-06-21 – 2016-06-22 (×2): 1 via ORAL
  Filled 2016-06-20 (×2): qty 1

## 2016-06-20 MED ORDER — PHENYLEPHRINE 40 MCG/ML (10ML) SYRINGE FOR IV PUSH (FOR BLOOD PRESSURE SUPPORT)
80.0000 ug | PREFILLED_SYRINGE | INTRAVENOUS | Status: DC | PRN
Start: 2016-06-20 — End: 2016-06-20
  Filled 2016-06-20: qty 5

## 2016-06-20 MED ORDER — PHENYLEPHRINE 40 MCG/ML (10ML) SYRINGE FOR IV PUSH (FOR BLOOD PRESSURE SUPPORT)
80.0000 ug | PREFILLED_SYRINGE | INTRAVENOUS | Status: DC | PRN
  Filled 2016-06-20: qty 5
  Filled 2016-06-20: qty 10

## 2016-06-20 MED ORDER — LIDOCAINE HCL (PF) 1 % IJ SOLN
30.0000 mL | INTRAMUSCULAR | Status: DC | PRN
  Administered 2016-06-20: 30 mL via SUBCUTANEOUS
  Filled 2016-06-20: qty 30

## 2016-06-20 MED ORDER — LACTATED RINGERS IV SOLN
500.0000 mL | INTRAVENOUS | Status: DC | PRN
Start: 2016-06-20 — End: 2016-06-20

## 2016-06-20 MED ORDER — ZOLPIDEM TARTRATE 5 MG PO TABS: 5.0000 mg | ORAL_TABLET | Freq: Every evening | ORAL | Status: DC | PRN

## 2016-06-20 MED ORDER — ONDANSETRON HCL 4 MG/2ML IJ SOLN: 4.0000 mg | INTRAMUSCULAR | Status: DC | PRN

## 2016-06-20 MED ORDER — ONDANSETRON HCL 4 MG PO TABS: 4.0000 mg | ORAL_TABLET | ORAL | Status: DC | PRN

## 2016-06-20 MED ORDER — SOD CITRATE-CITRIC ACID 500-334 MG/5ML PO SOLN: 30.0000 mL | ORAL | Status: DC | PRN

## 2016-06-20 MED ORDER — EPHEDRINE 5 MG/ML INJ
10.0000 mg | INTRAVENOUS | Status: DC | PRN
  Filled 2016-06-20: qty 2

## 2016-06-20 MED ORDER — LACTATED RINGERS IV SOLN
INTRAVENOUS | Status: DC
  Administered 2016-06-20: 14:00:00 via INTRAVENOUS

## 2016-06-20 MED ORDER — ONDANSETRON HCL 4 MG/2ML IJ SOLN: 4.0000 mg | Freq: Four times a day (QID) | INTRAMUSCULAR | Status: DC | PRN

## 2016-06-20 MED ORDER — COCONUT OIL OIL: 1.0000 "application " | TOPICAL_OIL | Status: DC | PRN

## 2016-06-20 MED ORDER — LIDOCAINE HCL (PF) 1 % IJ SOLN
INTRAMUSCULAR | Status: DC | PRN
  Administered 2016-06-20 (×2): 5 mL

## 2016-06-20 MED ORDER — OXYCODONE-ACETAMINOPHEN 5-325 MG PO TABS: 1.0000 | ORAL_TABLET | ORAL | Status: DC | PRN

## 2016-06-20 MED ORDER — BENZOCAINE-MENTHOL 20-0.5 % EX AERO
1.0000 "application " | INHALATION_SPRAY | CUTANEOUS | Status: DC | PRN
  Administered 2016-06-21: 1 via TOPICAL
  Filled 2016-06-20: qty 56

## 2016-06-20 MED ORDER — SIMETHICONE 80 MG PO CHEW: 80.0000 mg | CHEWABLE_TABLET | ORAL | Status: DC | PRN

## 2016-06-20 MED ORDER — DIBUCAINE 1 % RE OINT: 1.0000 "application " | TOPICAL_OINTMENT | RECTAL | Status: DC | PRN

## 2016-06-20 MED ORDER — DIPHENHYDRAMINE HCL 25 MG PO CAPS: 25.0000 mg | ORAL_CAPSULE | Freq: Four times a day (QID) | ORAL | Status: DC | PRN

## 2016-06-20 MED ORDER — OXYTOCIN 40 UNITS IN LACTATED RINGERS INFUSION - SIMPLE MED
2.5000 [IU]/h | INTRAVENOUS | Status: DC
  Administered 2016-06-20: 2.5 [IU]/h via INTRAVENOUS
  Filled 2016-06-20: qty 1000

## 2016-06-20 MED ORDER — FENTANYL 2.5 MCG/ML BUPIVACAINE 1/10 % EPIDURAL INFUSION (WH - ANES)
14.0000 mL/h | INTRAMUSCULAR | Status: DC | PRN
  Administered 2016-06-20: 14 mL/h via EPIDURAL
  Filled 2016-06-20: qty 125

## 2016-06-20 MED ORDER — EPHEDRINE 5 MG/ML INJ
10.0000 mg | INTRAVENOUS | Status: DC | PRN
Start: 2016-06-20 — End: 2016-06-20
  Filled 2016-06-20: qty 2

## 2016-06-20 MED ORDER — SODIUM CHLORIDE 0.9 % IV SOLN
3.0000 g | Freq: Once | INTRAVENOUS | Status: AC
  Administered 2016-06-20: 3 g via INTRAVENOUS
  Filled 2016-06-20: qty 3

## 2016-06-20 NOTE — Anesthesia Preprocedure Evaluation (Signed)

## 2016-06-20 NOTE — Anesthesia Pain Management Evaluation Note (Signed)
  CR[facility] Pain Management Visit Note  Patient: Janice Raymond, 30 y.o., female  "Hello I am a member of the anesthesia team at [facility]. We have an anesthesia team available at all times to provide care throughout the hospital, including epidural management and anesthesia for C-section. I don't know your plan for the delivery whether it a natural birth, water birth, IV sedation, nitrous supplementation, doula or epidural, but we want to meet your pain goals."   1.Was your pain managed to your expectations on prior hospitalizations?   No prior hospitalizations  2.What is your expectation for pain management during this hospitalization?     Epidural  3.How can we help you reach that goal? Assist as needed  Record the patient's initial score and the patient's pain goal.   Pain: 1  Pain Goal: 4 [facility] wants you to be able to say your pain was always managed very well.  [facility] CenterWRINKLE,Janice Raymond 06/20/2016

## 2016-06-20 NOTE — Anesthesia Procedure Notes (Signed)
Epidural Patient location during procedure: OB  Staffing Anesthesiologist: Agamjot Kilgallon Performed: anesthesiologist   Preanesthetic Checklist Completed: patient identified, site marked, surgical consent, pre-op evaluation, timeout performed, IV checked, risks and benefits discussed and monitors and equipment checked  Epidural Patient position: sitting Prep: DuraPrep Patient monitoring: heart rate, continuous pulse ox and blood pressure Approach: right paramedian Location: L3-L4 Injection technique: LOR saline  Needle:  Needle type: Tuohy  Needle gauge: 17 G Needle length: 9 cm and 9 Needle insertion depth: 6 cm Catheter type: closed end flexible Catheter size: 20 Guage Catheter at skin depth: 10 cm Test dose: negative  Assessment Events: blood not aspirated, injection not painful, no injection resistance, negative IV test and no paresthesia  Additional Notes Patient identified. Risks/Benefits/Options discussed with patient including but not limited to bleeding, infection, nerve damage, paralysis, failed block, incomplete pain control, headache, blood pressure changes, nausea, vomiting, reactions to medication both or allergic, itching and postpartum back pain. Confirmed with bedside nurse the patient's most recent platelet count. Confirmed with patient that they are not currently taking any anticoagulation, have any bleeding history or any family history of bleeding disorders. Patient expressed understanding and wished to proceed. All questions were answered. Sterile technique was used throughout the entire procedure. Please see nursing notes for vital signs. Test dose was given through epidural needle and negative prior to continuing to dose epidural or start infusion. Warning signs of high block given to the patient including shortness of breath, tingling/numbness in hands, complete motor block, or any concerning symptoms with instructions to call for help. Patient was given  instructions on fall risk and not to get out of bed. All questions and concerns addressed with instructions to call with any issues.     

## 2016-06-20 NOTE — MAU Note (Signed)
Pt states she has been having contractions since 0400 this morning and they are 8 minutes apart.  Pt is having some bleeding when she wipes and thinks it is her mucus plug.  Pt denies leaking of fluid.  Pt states she is feeling the baby move.

## 2016-06-20 NOTE — H&P (Signed)
Janice Raymond is a 30 y.o. G1P0 at 6218w1d presenting for active. Pt notes onset contractions 4am . Good fetal movement, No vaginal bleeding, not leaking fluid.  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 No pertinent past medical history. Past Surgical History:  Procedure Laterality Date  . NO PAST SURGERIES     Family History: family history is not on file. Social History:  reports that she has never smoked. She has never used smokeless tobacco. She reports that she does not drink alcohol or use drugs.  Review of Systems - Negative except painful contractions   Dilation:  (unable to determine -patient moving to much) Effacement (%): 90 Station: -2 Exam by:: Lajuana Matteina Jacobs, RN Blood pressure 118/77, pulse (!) 106, temperature 98 F (36.7 C), temperature source Oral, resp. rate 18, height 5\' 5"  (1.651 m), weight 87.1 kg (192 lb), last menstrual period 07/12/2015.  Physical Exam:  Gen: well appearing, no distress  Abd: gravid, NT, no RUQ pain LE: trace edema, equal bilaterally, non-tender Toco: q2-5 FH: baseline 140s, accelerations present, no deceleratons, 10 beat variability AROM thin meconium cvx 5/70%/ vtx -2  Prenatal labs: ABO, Rh: --/--/B POS (08/26 1425) Antibody: NEG (08/26 1425) Rubella: !Error!immune RPR: Nonreactive (02/01 0000)  HBsAg: Negative (02/01 0000)  HIV: Non-reactive (02/01 0000)  GBS:   neg 1 hr Glucola  133  Genetic screening not done3 Anatomy US normal   Assessment/Plan: 30 y.o. G1P0 at 5318w1d Post-dates. Active labor.  Epidural Expectant management.   Rosalyn Archambault A. 06/20/2016, 3:55 PM

## 2016-06-21 ENCOUNTER — Inpatient Hospital Stay (HOSPITAL_COMMUNITY): Admission: RE | Admit: 2016-06-21 | Payer: PRIVATE HEALTH INSURANCE | Source: Ambulatory Visit

## 2016-06-21 LAB — RPR: RPR Ser Ql: NONREACTIVE

## 2016-06-21 NOTE — Anesthesia Postprocedure Evaluation (Signed)
Anesthesia Post Note  Patient: Janice Raymond  Procedure(s) Performed: * No procedures listed *  Patient location during evaluation: Mother Baby Anesthesia Type: Epidural Level of consciousness: awake and alert, oriented and patient cooperative Pain management: pain level controlled Vital Signs Assessment: post-procedure vital signs reviewed and stable Respiratory status: spontaneous breathing Cardiovascular status: stable Postop Assessment: no headache, epidural receding, patient able to bend at knees and no signs of nausea or vomiting Anesthetic complications: no Comments: Denies pain.     Last Vitals:  Vitals:   06/21/16 0200 06/21/16 0600  BP: 112/60 118/75  Pulse: 100 96  Resp: 18 18  Temp: 36.9 C 36.7 C    Last Pain:  Vitals:   06/21/16 0759  TempSrc:   PainSc: 0-No pain   Pain Goal:                 [facility]ice Raymond

## 2016-06-21 NOTE — Progress Notes (Signed)
Patient ID: Janice Raymond, female   DOB: November 22, 1985, 30 y.o.   MRN: [id number] PPD # 1 SVD Information for the patient's newborn:  Berniece AndreasChapman, Boy Kirat [[id number]][[id number]]  female    Breast feeding  / Circumcision planned, no void yet   S:  Reports feeling tired but well, no concerns today.             Tolerating po/ No nausea or vomiting             Bleeding is decreased.             Pain controlled with ibuprofen (OTC)             Up ad lib / ambulatory / voiding without difficulties        O:  A & O x 3, in no apparent distress              VS:  Vitals:   06/20/16 2100 06/20/16 2205 06/21/16 0200 06/21/16 0600  BP: 116/67 102/63 112/60 118/75  Pulse: 95 (!) 110 100 96  Resp: 18 18 18 18   Temp: 98.6 F (37 C) 99 F (37.2 C) 98.5 F (36.9 C) 98.1 F (36.7 C)  TempSrc: Oral Oral Oral   SpO2: 96% 98% 96%   Weight:      Height:        LABS:  Recent Labs  06/20/16 1425  WBC 8.9  HGB 11.4*  HCT 34.1*  PLT 224    Blood type: --/--/B POS, B POS (08/26 1425)  Rubella: Immune (02/01 0000)   I&O: I/O last 3 completed shifts: In: -  Out: 850 [Urine:550; Blood:300]          No intake/output data recorded.  Lungs: Clear and unlabored  Heart: regular rate and rhythm / no murmurs  Abdomen: soft, non-tender, non-distended             Fundus: firm, non-tender, U-1  Perineum: no edema, repair intact  Lochia: small  Extremities: no edema, no calf pain or tenderness    A/P: PPD # 1 30 y.o., G1P1001   Principal Problem:   Postpartum care following vaginal delivery (8/26) Active Problems:   Second-degree perineal laceration, with delivery   Doing well - stable status  Routine post partum orders  Anticipate discharge tomorrow    Neta Mendsaniela C Paul, MSN, CNM 06/21/2016, 10:51 AM

## 2016-06-21 NOTE — Lactation Note (Signed)
This note was copied from a baby's chart. Lactation Consultation Note Breastfeeding services and support information given and reviewed.  Baby is 2221 hours old and has been inconsistent with latching.  Baby positioned in football hold. Instructed on and demonstrated hand expression.  Colostrum easily expressed. Baby has a tight jaw and does not open mouth wide.  Gentle jaw massage done and baby opened enough to latch.  Initially latch was shallow but as baby continued it became deeper.  Mom comfortable.  Breast massage and waking techniques shown and encouraged.  Instructed to feed with any cue and to call out for assist/concerns prn. Patient Name: Boy Eather Colasdrian Barnhardt ZOXWR'UToday's Date: 06/21/2016 Reason for consult: Initial assessment   Maternal Data Has patient been taught Hand Expression?: Yes Does the patient have breastfeeding experience prior to this delivery?: No  Feeding Feeding Type: Breast Fed (Simultaneous filing. User may not have seen previous data.) Length of feed: 30 min  LATCH Score/Interventions Latch: Grasps breast easily, tongue down, lips flanged, rhythmical sucking. Intervention(s): Adjust position;Assist with latch;Breast massage;Breast compression  Audible Swallowing: A few with stimulation Intervention(s): Skin to skin;Hand expression;Alternate breast massage  Type of Nipple: Everted at rest and after stimulation  Comfort (Breast/Nipple): Soft / non-tender     Hold (Positioning): Assistance needed to correctly position infant at breast and maintain latch. Intervention(s): Breastfeeding basics reviewed;Support Pillows;Position options;Skin to skin  LATCH Score: 8  Lactation Tools Discussed/Used     Consult Status Consult Status: Follow-up Date: 06/22/16 Follow-up type: In-patient    Huston FoleyMOULDEN, Brookley Spitler S 06/21/2016, 3:59 PM

## 2016-06-22 MED ORDER — IBUPROFEN 600 MG PO TABS: 600.0000 mg | ORAL_TABLET | Freq: Four times a day (QID) | ORAL | 0 refills | Status: DC

## 2016-06-22 NOTE — Progress Notes (Signed)
PPD 2 SVD  S:  Reports feeling well - ready to go home             Tolerating po/ No nausea or vomiting             Bleeding is light             Pain controlled with motrin             Up ad lib / ambulatory / voiding QS  Newborn breast feeding   O:               VS: BP 102/65   Pulse 75   Temp 98.8 F (37.1 C) (Oral)   Resp 18   Ht 5\' 5"  (1.651 m)   Wt 87.1 kg (192 lb)   LMP 07/12/2015   SpO2 96%   Breastfeeding? Unknown   BMI 31.95 kg/m    LABS:              Recent Labs  06/20/16 1425  WBC 8.9  HGB 11.4*  PLT 224               Blood type: --/--/B POS, B POS (08/26 1425)  Rubella: Immune (02/01 0000)                                Physical Exam:             Alert and oriented X3  Abdomen: soft, non-tender, non-distended              Fundus: firm, non-tender, U-1  Perineum: mild edema  Lochia: light  Extremities: trace pedal edema, no calf pain or tenderness    A: PPD # 2   Doing well - stable status  P: Routine post partum orders  DC home  Marlinda MikeBAILEY, Ivo Moga CNM, MSN, [facility] 06/22/2016, 12:40 PM

## 2016-06-22 NOTE — Lactation Note (Signed)
This note was copied from a baby's chart. Lactation Consultation Note  Patient Name: Janice Raymond ZOXWR'UToday's Date: 06/22/2016  Mom states feedings are going well and lasting longer.  Breasts are filling.  No concerns at present.  Lactation outpatient services and support reviewed and encouraged prn.   Maternal Data    Feeding    LATCH Score/Interventions                      Lactation Tools Discussed/Used     Consult Status      Huston FoleyMOULDEN, Chad Donoghue S 06/22/2016, 12:22 PM

## 2016-06-22 NOTE — Discharge Summary (Signed)
Obstetric Discharge Summary Reason for Admission: onset of labor Prenatal Procedures: none Intrapartum Procedures: spontaneous vaginal delivery Postpartum Procedures: none Complications-Operative and Postpartum: 2nd degree perineal laceration Hemoglobin  Date Value Ref Range Status  06/20/2016 11.4 (L) 12.0 - 15.0 g/dL Final   HCT  Date Value Ref Range Status  06/20/2016 34.1 (L) 36.0 - 46.0 % Final    Physical Exam:  General: alert, cooperative and no distress Lochia: appropriate Uterine Fundus: firm Incision: healing well DVT Evaluation: No evidence of DVT seen on physical exam.  Discharge Diagnoses: Term Pregnancy-delivered  Discharge Information: Date: 06/22/2016 Activity: pelvic rest Diet: routine Medications: PNV and Ibuprofen Condition: stable Instructions: refer to practice specific booklet Discharge to: home Follow-up Information    LAVOIE,MARIE-LYNE, MD. Schedule an appointment as soon as possible for a visit in 6 week(s).   Specialty:  Obstetrics and Gynecology Contact information: [address] [place] [address] [phone]           Newborn Data: Live born female  Birth Weight: 7 lb 13 oz (3544 g) APGAR: 9, 9  Home with mother.  Janice Raymond, Janice Raymond 06/22/2016, 12:47 PM

## 2016-08-10 ENCOUNTER — Other Ambulatory Visit: Payer: Self-pay | Admitting: Obstetrics & Gynecology

## 2016-08-13 ENCOUNTER — Inpatient Hospital Stay (HOSPITAL_COMMUNITY)
Admission: RE | Admit: 2016-08-13 | Discharge: 2016-08-13 | Disposition: A | Discharge status: Home / Self Care | Payer: PRIVATE HEALTH INSURANCE | Source: Ambulatory Visit

## 2016-08-24 ENCOUNTER — Ambulatory Visit: Admit: 2016-08-24 | Payer: PRIVATE HEALTH INSURANCE | Admitting: Obstetrics & Gynecology

## 2016-08-24 SURGERY — LIGATION, FALLOPIAN TUBE, LAPAROSCOPIC
Anesthesia: General | Laterality: Bilateral

## 2016-10-02 ENCOUNTER — Other Ambulatory Visit: Payer: Self-pay | Admitting: Family Medicine

## 2016-10-02 MED ORDER — OMEPRAZOLE 20 MG PO CPDR: 20.0000 mg | DELAYED_RELEASE_CAPSULE | Freq: Every day | ORAL | 5 refills | Status: DC

## 2016-10-02 NOTE — Telephone Encounter (Signed)
Patient is calling requesting a refill of omeprazole (PRILOSEC) 20 MG capsule Please advise   Pharmacy: [id number] - [place], [place] - [address]

## 2016-10-02 NOTE — Telephone Encounter (Signed)
Janice Raymond-- pt last seen 11/2015 and has no future appointments scheduled. Please advise?

## 2016-10-02 NOTE — Addendum Note (Signed)
Addended by: Mervin KungFERGERSON, Warnie Belair A on: 10/02/2016 04:42 PM   Modules accepted: Orders

## 2016-10-02 NOTE — Telephone Encounter (Signed)
Refills sent. Please call pt to schedule cpe in February. Thanks!

## 2016-10-02 NOTE — Telephone Encounter (Signed)
Ok to send refills. 1 year follow up is fine.

## 2016-10-05 NOTE — Telephone Encounter (Signed)
LVM informing patient that refills were sent in and to call and schedule CPE

## 2016-11-16 ENCOUNTER — Ambulatory Visit (INDEPENDENT_AMBULATORY_CARE_PROVIDER_SITE_OTHER): Payer: BLUE CROSS/BLUE SHIELD | Admitting: Family Medicine

## 2016-11-16 ENCOUNTER — Encounter: Payer: Self-pay | Admitting: Family Medicine

## 2016-11-16 VITALS — BP 108/71 | HR 88 | Temp 97.9°F | Ht 65.0 in | Wt 180.2 lb

## 2016-11-16 DIAGNOSIS — R21 Rash and other nonspecific skin eruption: Secondary | ICD-10-CM | POA: Diagnosis not present

## 2016-11-16 NOTE — Progress Notes (Signed)
[facility] [address], [address] [place], [place] [address] [phone] [phone]  Date:  11/16/2016   Name:  Janice Raymond   DOB:  05-06-1986   MRN:  [id number]  PCP:  Neena RhymesKatherine Tabori, MD    Chief Complaint: Rash (pt. reports a rash on RT arm & hand x 3 mo.; off/on itching; no redness or swelling present)   History of Present Illness:  Janice Raymond is a 31 y.o. very pleasant female patient who presents with the following:  She is here today with concern of a rash on her right wrist/ hand. It has been present since October- she tried a monistat cream and hydrocortisone but they did not clear it up The rash can be itchy but not generally It does not hurt No other rash on her body No one else at home has this  She recently delivered her first born, a son. He is doing very well, is still nursing.  Janice Raymond is adjusting well but does wonder "when I will get my pre-baby body back."  We discussed healthful eating and exercise, and reassured her that it does generally take several months to lose the "baby weight" after delivery  Patient Active Problem List   Diagnosis Date Noted  . Postpartum care following vaginal delivery (8/26) 06/21/2016  . Second-degree perineal laceration, with delivery 06/21/2016  . Fatigue 11/05/2014  . GERD (gastroesophageal reflux disease) 11/05/2014  . Obesity, unspecified 12/28/2013  . LLQ pain 01/16/2013  . Routine gynecological examination 09/19/2012  . HSV 06/27/2010    No past medical history on file.  Past Surgical History:  Procedure Laterality Date  . NO PAST SURGERIES      Social History  Substance Use Topics  . Smoking status: Never Smoker  . Smokeless tobacco: Never Used  . Alcohol use No    No family history on file.  No Known Allergies  Medication list has been reviewed and updated.  Current Outpatient Prescriptions on File Prior to Visit  Medication Sig Dispense Refill  .  omeprazole (PRILOSEC) 20 MG capsule Take 1 capsule (20 mg total) by mouth daily. 30 capsule 5  . Prenatal Vit-Fe Fumarate-FA (PRENATAL MULTIVITAMIN) TABS tablet Take 1 tablet by mouth every evening.     No current facility-administered medications on file prior to visit.     Review of Systems:  As per HPI- otherwise negative.   Physical Examination: Vitals:   11/16/16 1442  BP: 108/71  Pulse: 88  Temp: 97.9 F (36.6 C)   Vitals:   11/16/16 1442  Weight: 180 lb 3.2 oz (81.7 kg)  Height: 5\' 5"  (1.651 m)   Body mass index is 29.99 kg/m. Ideal Body Weight: Weight in (lb) to have BMI = 25: 149.9  GEN: WDWN, NAD, Non-toxic, A & O x 3, looks well HEENT: Atraumatic, Normocephalic. Neck supple. No masses, No LAD. Ears and Nose: No external deformity. CV: RRR, No M/G/R. No JVD. No thrill. No extra heart sounds. PULM: CTA B, no wheezes, crackles, rhonchi. No retractions. No resp. distress. No accessory muscle use. EXTR: No c/c/e NEURO Normal gait.  PSYCH: Normally interactive. Conversant. Not depressed or anxious appearing.  Calm demeanor.  She has a round shaped cluster of palpable, hyperpigmented lesion on the right wrist and a smaller cluster on the base of her right thumb. I am not certain of what this is Assessment and Plan: Rash and nonspecific skin eruption - Plan: Ambulatory referral to Dermatology  Here today with a skin discoloration on the right hand and wrist of 3 months duration.  I do not recognize this skin lesion.  Will refer to derm - referral placed today   Signed Abbe Amsterdam, MD

## 2016-11-16 NOTE — Progress Notes (Signed)
Pre visit review using our clinic review tool, if applicable. No additional management support is needed unless otherwise documented below in the visit note. 

## 2016-12-14 ENCOUNTER — Other Ambulatory Visit (HOSPITAL_COMMUNITY)
Admission: RE | Admit: 2016-12-14 | Discharge: 2016-12-14 | Disposition: A | Discharge status: Home / Self Care | Payer: BLUE CROSS/BLUE SHIELD | Source: Ambulatory Visit | Attending: Family | Admitting: Family

## 2016-12-14 ENCOUNTER — Encounter: Payer: Self-pay | Admitting: Family

## 2016-12-14 ENCOUNTER — Ambulatory Visit (INDEPENDENT_AMBULATORY_CARE_PROVIDER_SITE_OTHER): Payer: BLUE CROSS/BLUE SHIELD | Admitting: Family

## 2016-12-14 VITALS — BP 106/64 | HR 92 | Temp 98.2°F | Ht 65.0 in | Wt 182.6 lb

## 2016-12-14 DIAGNOSIS — N760 Acute vaginitis: Secondary | ICD-10-CM

## 2016-12-14 NOTE — Progress Notes (Signed)
   Subjective:    Patient ID: Janice Raymond, female    DOB: May 28, 1986, 31 y.o.   MRN: [id number]  HPI   Janice Raymond is  31 yr old female who presents today with chief complaint of vaginal discharge.  She is currently nursing. Has not had a period since he son was born. She is on camila.  Reports that she developed vaginal discharge on Sunday.  Notes clear/white discharge.  Burning sensation on vulva. Used a dose of monistat 7 last night.  Reports that she had a vaginal odor after sex.      Review of Systems See HPI  No past medical history on file.   Social History   Social History  . Marital status: Married    Spouse name: N/A  . Number of children: N/A  . Years of education: N/A   Occupational History  . Not on file.   Social History Main Topics  . Smoking status: Never Smoker  . Smokeless tobacco: Never Used  . Alcohol use No  . Drug use: No  . Sexual activity: Yes    Birth control/ protection: Inserts     Comment: NuvaRing   Other Topics Concern  . Not on file   Social History Narrative  . No narrative on file    Past Surgical History:  Procedure Laterality Date  . NO PAST SURGERIES      No family history on file.  No Known Allergies  Current Outpatient Prescriptions on File Prior to Visit  Medication Sig Dispense Refill  . norethindrone (MICRONOR,CAMILA,ERRIN) 0.35 MG tablet Take 1 tablet by mouth daily.     Marland Kitchen. omeprazole (PRILOSEC) 20 MG capsule Take 1 capsule (20 mg total) by mouth daily. 30 capsule 5  . Prenatal Vit-Fe Fumarate-FA (PRENATAL MULTIVITAMIN) TABS tablet Take 1 tablet by mouth every evening.     No current facility-administered medications on file prior to visit.     BP 106/64 (BP Location: Right Arm, Patient Position: Sitting, Cuff Size: Large)   Pulse 92   Temp 98.2 F (36.8 C) (Oral)   Ht 5\' 5"  (1.651 m)   Wt 182 lb 9.6 oz (82.8 kg)   SpO2 100%   BMI 30.39 kg/m       Objective:   Physical Exam  Constitutional: She is  oriented to person, place, and time. She appears well-developed and well-nourished. No distress.  Genitourinary:  Genitourinary Comments: Some clear vaginal discharge  Musculoskeletal: She exhibits no edema.  Neurological: She is alert and oriented to person, place, and time.  Psychiatric: She has a normal mood and affect. Her behavior is normal. Judgment and thought content normal.          Assessment & Plan:  Vaginitis- Suspect BV. Await results of wet prep prior to treatment recommendations.

## 2016-12-14 NOTE — Patient Instructions (Signed)
We will contact you with the results of your testing and further recommedations.

## 2016-12-14 NOTE — Progress Notes (Signed)
Pre visit review using our clinic review tool, if applicable. No additional management support is needed unless otherwise documented below in the visit note. 

## 2016-12-14 NOTE — Addendum Note (Signed)
Addended by: Orlene OchRENCE, Clarise Chacko N on: 12/14/2016 02:32 PM   Modules accepted: Orders

## 2016-12-16 ENCOUNTER — Encounter: Payer: Self-pay | Admitting: Family

## 2016-12-16 LAB — CERVICOVAGINAL ANCILLARY ONLY: WET PREP (BD AFFIRM): POSITIVE — AB

## 2016-12-17 ENCOUNTER — Encounter: Payer: Self-pay | Admitting: Family

## 2016-12-17 ENCOUNTER — Other Ambulatory Visit: Payer: Self-pay | Admitting: Family

## 2016-12-17 MED ORDER — FLUCONAZOLE 150 MG PO TABS: ORAL_TABLET | ORAL | 0 refills | Status: DC

## 2016-12-17 NOTE — Progress Notes (Signed)
See mychart.  

## 2016-12-21 DIAGNOSIS — M67432 Ganglion, left wrist: Secondary | ICD-10-CM | POA: Insufficient documentation

## 2016-12-21 HISTORY — DX: Ganglion, left wrist: M67.432

## 2016-12-29 ENCOUNTER — Other Ambulatory Visit: Payer: Self-pay | Admitting: Orthopedic Surgery

## 2016-12-29 DIAGNOSIS — M67432 Ganglion, left wrist: Secondary | ICD-10-CM

## 2016-12-30 ENCOUNTER — Ambulatory Visit
Admission: RE | Admit: 2016-12-30 | Discharge: 2016-12-30 | Disposition: A | Discharge status: Home / Self Care | Payer: BLUE CROSS/BLUE SHIELD | Source: Ambulatory Visit | Attending: Orthopedic Surgery | Admitting: Orthopedic Surgery

## 2016-12-30 DIAGNOSIS — M67432 Ganglion, left wrist: Secondary | ICD-10-CM

## 2017-01-20 ENCOUNTER — Encounter: Payer: Self-pay | Admitting: Family

## 2017-01-21 MED ORDER — ACYCLOVIR 400 MG PO TABS: 400.0000 mg | ORAL_TABLET | Freq: Two times a day (BID) | ORAL | 3 refills | Status: DC

## 2017-02-08 ENCOUNTER — Ambulatory Visit (INDEPENDENT_AMBULATORY_CARE_PROVIDER_SITE_OTHER): Payer: BLUE CROSS/BLUE SHIELD | Admitting: Family

## 2017-02-08 ENCOUNTER — Encounter: Payer: Self-pay | Admitting: Family

## 2017-02-08 VITALS — BP 108/85 | HR 73 | Temp 98.1°F | Resp 16 | Ht 65.0 in | Wt 179.0 lb

## 2017-02-08 DIAGNOSIS — Z0000 Encounter for general adult medical examination without abnormal findings: Secondary | ICD-10-CM | POA: Diagnosis not present

## 2017-02-08 NOTE — Patient Instructions (Signed)
Please complete lab work prior to leaving. Work on [employer], continue exercise.

## 2017-02-08 NOTE — Progress Notes (Signed)
Pre visit review using our clinic review tool, if applicable. No additional management support is needed unless otherwise documented below in the visit note. 

## 2017-02-08 NOTE — Progress Notes (Signed)
Subjective:    Patient ID: Janice Raymond, female    DOB: [birth year], 31 y.o.   MRN: [id number]  HPI  Patient presents today for complete physical.  Immunizations: 2014 Diet: fair diet has a "sweet tooth", overall healthy diet.  [facility] walks with the [place] Pap Smear: 5/17- normal per patient  Wt Readings from Last 3 Encounters:  02/08/17 179 lb (81.2 kg)  12/14/16 182 lb 9.6 oz (82.8 kg)  11/16/16 180 lb 3.2 oz (81.7 kg)     Review of Systems  Constitutional: Negative for unexpected weight change.  HENT: Negative for hearing loss and rhinorrhea.   Eyes: Negative for visual disturbance.  Respiratory: Negative for cough.   Cardiovascular: Negative for chest pain.  Gastrointestinal: Negative for constipation and diarrhea.  Genitourinary: Negative for dysuria and frequency.       No period- breast feeding  Musculoskeletal: Negative for arthralgias and myalgias.  Skin: Negative for rash.  Neurological: Negative for headaches.  Hematological: Negative for adenopathy.  Psychiatric/Behavioral:       Denies anxiety.  Denies depression   History reviewed. No pertinent past medical history.   Social History   Social History  . Marital status: Married    Spouse name: N/A  . Number of children: N/A  . Years of education: N/A   Occupational History  . Not on file.   Social History Main Topics  . Smoking status: Never Smoker  . Smokeless tobacco: Never Used  . Alcohol use 4.2 oz/week    7 Glasses of wine per week  . Drug use: No  . Sexual activity: Yes    Birth control/ protection: Other-see comments     Comment: Husband had vasectomy   Other Topics Concern  . Not on file   Social History Narrative   One son born 2017   Has worked at [employer]   Enjoys sleeping, thrift shopping   Married       Past Surgical History:  Procedure Laterality Date  . NO PAST SURGERIES      Family History  Problem Relation Age of Onset  .  Diabetes Mother   . Depression Mother   . Schizophrenia Mother   . Vitamin D deficiency Father   . Breast cancer Maternal Aunt   . Prostate cancer Paternal Uncle   . Breast cancer Maternal Aunt   . Breast cancer Maternal Aunt   . Prostate cancer Paternal Uncle     No Known Allergies  Current Outpatient Prescriptions on File Prior to Visit  Medication Sig Dispense Refill  . omeprazole (PRILOSEC) 20 MG capsule Take 1 capsule (20 mg total) by mouth daily. 30 capsule 5   No current facility-administered medications on file prior to visit.     BP 108/85 (BP Location: Right Arm, Cuff Size: Normal)   Pulse 73   Temp 98.1 F (36.7 C) (Oral)   Resp 16   Ht  (1.651 m)   Wt 179 lb (81.2 kg)   SpO2 100% Comment: room air  BMI 29.79 kg/m       Objective:   Physical Exam  Physical Exam  Constitutional: She is oriented to person, place, and time. She appears well-developed and well-nourished. No distress.  HENT:  Head: Normocephalic and atraumatic.  Right Ear: Tympanic membrane and ear canal normal.  Left Ear: Tympanic membrane and ear canal normal.  Mouth/Throat: Oropharynx is clear and moist.  Eyes: Pupils are equal, round, and reactive to  light. No scleral icterus.  Neck: Normal range of motion. No thyromegaly present.  Cardiovascular: Normal rate and regular rhythm.   No murmur heard. Pulmonary/Chest: Effort normal and breath sounds normal. No respiratory distress. He has no wheezes. She has no rales. She exhibits no tenderness.  Abdominal: Soft. Bowel sounds are normal. She exhibits no distension and no mass. There is no tenderness. There is no rebound and no guarding.  Musculoskeletal: She exhibits no edema.  Lymphadenopathy:    She has no cervical adenopathy.  Neurological: She is alert and oriented to person, place, and time. She has normal patellar reflexes. She exhibits normal muscle tone. Coordination normal.  Skin: Skin is warm and dry.  Psychiatric: She has a  normal mood and affect. Her behavior is normal. Judgment and thought content normal.  Breasts: Examined lying Right: Without masses, retractions, discharge or axillary adenopathy.  Left: Without masses, retractions, discharge or axillary adenopathy.  Pelvic: deferred           Assessment & Plan:         Assessment & Plan:  Preventative care- discussed healthy diet, exercise. Obtain routine lab. Pap, immunizations up to date.

## 2017-02-09 ENCOUNTER — Encounter: Payer: Self-pay | Admitting: Family

## 2017-02-09 LAB — HEPATIC FUNCTION PANEL
ALT: 18 U/L (ref 0–35)
AST: 19 U/L (ref 0–37)
Albumin: 4.5 g/dL (ref 3.5–5.2)
Alkaline Phosphatase: 117 U/L (ref 39–117)
BILIRUBIN DIRECT: 0.1 mg/dL (ref 0.0–0.3)
BILIRUBIN TOTAL: 0.5 mg/dL (ref 0.2–1.2)
Total Protein: 7.7 g/dL (ref 6.0–8.3)

## 2017-02-09 LAB — CBC WITH DIFFERENTIAL/PLATELET
BASOS PCT: 0.6 % (ref 0.0–3.0)
Basophils Absolute: 0.1 10*3/uL (ref 0.0–0.1)
EOS PCT: 0.8 % (ref 0.0–5.0)
Eosinophils Absolute: 0.1 10*3/uL (ref 0.0–0.7)
HCT: 40.7 % (ref 36.0–46.0)
HEMOGLOBIN: 13.1 g/dL (ref 12.0–15.0)
LYMPHS PCT: 34.1 % (ref 12.0–46.0)
Lymphs Abs: 3 10*3/uL (ref 0.7–4.0)
MCHC: 32.1 g/dL (ref 30.0–36.0)
MCV: 85.9 fl (ref 78.0–100.0)
MONO ABS: 0.9 10*3/uL (ref 0.1–1.0)
Monocytes Relative: 9.6 % (ref 3.0–12.0)
Neutro Abs: 4.9 10*3/uL (ref 1.4–7.7)
Neutrophils Relative %: 54.9 % (ref 43.0–77.0)
Platelets: 299 10*3/uL (ref 150.0–400.0)
RBC: 4.74 Mil/uL (ref 3.87–5.11)
RDW: 13 % (ref 11.5–15.5)
WBC: 8.9 10*3/uL (ref 4.0–10.5)

## 2017-02-09 LAB — URINALYSIS, ROUTINE W REFLEX MICROSCOPIC
BILIRUBIN URINE: NEGATIVE
HGB URINE DIPSTICK: NEGATIVE
Ketones, ur: NEGATIVE
LEUKOCYTES UA: NEGATIVE
Nitrite: NEGATIVE
RBC / HPF: NONE SEEN (ref 0–?)
Specific Gravity, Urine: 1.015 (ref 1.000–1.030)
TOTAL PROTEIN, URINE-UPE24: NEGATIVE
UROBILINOGEN UA: 0.2 (ref 0.0–1.0)
Urine Glucose: NEGATIVE
WBC, UA: NONE SEEN (ref 0–?)
pH: 6 (ref 5.0–8.0)

## 2017-02-09 LAB — BASIC METABOLIC PANEL
BUN: 10 mg/dL (ref 6–23)
CO2: 29 mEq/L (ref 19–32)
Calcium: 9.8 mg/dL (ref 8.4–10.5)
Chloride: 104 mEq/L (ref 96–112)
Creatinine, Ser: 0.72 mg/dL (ref 0.40–1.20)
GFR: 121.23 mL/min (ref >60.00)
Glucose, Bld: 81 mg/dL (ref 70–99)
POTASSIUM: 4.9 meq/L (ref 3.5–5.1)
SODIUM: 139 meq/L (ref 135–145)

## 2017-02-09 LAB — LIPID PANEL
CHOL/HDL RATIO: 3
Cholesterol: 184 mg/dL (ref 0–200)
HDL: 72.4 mg/dL (ref >39.00)
LDL Cholesterol: 80 mg/dL (ref 0–99)
NonHDL: 111.28
TRIGLYCERIDES: 155 mg/dL — AB (ref 0.0–149.0)
VLDL: 31 mg/dL (ref 0.0–40.0)

## 2017-02-09 LAB — TSH: TSH: 0.96 u[IU]/mL (ref 0.35–4.50)

## 2017-02-10 LAB — VITAMIN D 1,25 DIHYDROXY
Vitamin D 1, 25 (OH)2 Total: 72 pg/mL (ref 18–72)
Vitamin D3 1, 25 (OH)2: 72 pg/mL

## 2017-02-22 ENCOUNTER — Telehealth: Payer: Self-pay | Admitting: Family

## 2017-02-22 NOTE — Telephone Encounter (Signed)
Pt confirmed she is seeing below doctor for GI issues. Labs faxed to below #.

## 2017-02-22 NOTE — Telephone Encounter (Signed)
Janice Raymond - Dr. MiseClydie Brauneimer's office called in to request pt's CBC and CMP results to prevent repeating labs for pt.    Fax: [phone]  She says that once provider completes his assessment he will fax over notes to PCP.Marland Kitchen Provided our fax # to office.

## 2017-02-22 NOTE — Telephone Encounter (Signed)
I am unsure which Dr Jennye Boroughs this is referring to as there is no phone number listed in this note; just a fax #. Sent mychart message to pt to verify that it is ok to release the results. Awaiting response.

## 2017-02-23 ENCOUNTER — Encounter: Payer: Self-pay | Admitting: Family

## 2017-04-26 ENCOUNTER — Telehealth: Payer: Self-pay | Admitting: *Deleted

## 2017-04-26 MED ORDER — OMEPRAZOLE 20 MG PO CPDR: 20.0000 mg | DELAYED_RELEASE_CAPSULE | Freq: Every day | ORAL | 5 refills | Status: DC

## 2017-04-26 NOTE — Telephone Encounter (Signed)
Faxed refill request received from [facility] for Omeprazole 20 mg cap Last filled by MD on 10/02/16, #30x5 Last AEX - 02/08/17 Refill sent per [facility]

## 2017-06-10 ENCOUNTER — Telehealth: Payer: Self-pay | Admitting: Family

## 2017-06-10 NOTE — Telephone Encounter (Signed)
Caller name: [name]  Relation to pt: self Call back number: [phone] Pharmacy:  Reason for call: Pt requesting a copy of Immunization records and would like to know if all immunization info is on chart from the other offices where she went to. Please advise pt and let her know when copy ready to pick up.

## 2017-06-11 NOTE — Telephone Encounter (Signed)
Spoke with pt. She is able to view immunizations in [place] and states that HPV series is missing. Advised her that what is showing in mychart is most updated list we have. Nothing further needed at this time.

## 2017-06-13 ENCOUNTER — Other Ambulatory Visit: Payer: Self-pay | Admitting: Family Medicine

## 2017-09-21 ENCOUNTER — Other Ambulatory Visit: Payer: Self-pay | Admitting: Family

## 2017-11-26 ENCOUNTER — Encounter: Payer: Self-pay | Admitting: Medical

## 2017-11-26 ENCOUNTER — Other Ambulatory Visit: Payer: Self-pay

## 2017-11-26 ENCOUNTER — Emergency Department (HOSPITAL_BASED_OUTPATIENT_CLINIC_OR_DEPARTMENT_OTHER): Payer: BLUE CROSS/BLUE SHIELD

## 2017-11-26 ENCOUNTER — Encounter (HOSPITAL_BASED_OUTPATIENT_CLINIC_OR_DEPARTMENT_OTHER): Payer: Self-pay | Admitting: Emergency Medicine

## 2017-11-26 ENCOUNTER — Emergency Department (HOSPITAL_BASED_OUTPATIENT_CLINIC_OR_DEPARTMENT_OTHER)
Admission: EM | Admit: 2017-11-26 | Discharge: 2017-11-26 | Disposition: A | Discharge status: Home / Self Care | Payer: BLUE CROSS/BLUE SHIELD | Attending: Emergency Medicine | Admitting: Emergency Medicine

## 2017-11-26 ENCOUNTER — Ambulatory Visit: Payer: BLUE CROSS/BLUE SHIELD | Admitting: Medical

## 2017-11-26 VITALS — BP 99/68 | HR 94 | Temp 98.2°F | Resp 16 | Ht 65.0 in | Wt 183.2 lb

## 2017-11-26 DIAGNOSIS — R0981 Nasal congestion: Secondary | ICD-10-CM | POA: Insufficient documentation

## 2017-11-26 DIAGNOSIS — Z79899 Other long term (current) drug therapy: Secondary | ICD-10-CM | POA: Insufficient documentation

## 2017-11-26 DIAGNOSIS — R079 Chest pain, unspecified: Secondary | ICD-10-CM

## 2017-11-26 DIAGNOSIS — R05 Cough: Secondary | ICD-10-CM | POA: Diagnosis not present

## 2017-11-26 DIAGNOSIS — M79602 Pain in left arm: Secondary | ICD-10-CM | POA: Diagnosis not present

## 2017-11-26 LAB — CBC WITH DIFFERENTIAL/PLATELET
BASOS ABS: 0 10*3/uL (ref 0.0–0.1)
Basophils Relative: 0 %
EOS ABS: 0.2 10*3/uL (ref 0.0–0.7)
Eosinophils Relative: 2 %
HCT: 37.2 % (ref 36.0–46.0)
Hemoglobin: 11.9 g/dL — ABNORMAL LOW (ref 12.0–15.0)
LYMPHS ABS: 3.2 10*3/uL (ref 0.7–4.0)
Lymphocytes Relative: 29 %
MCH: 27.1 pg (ref 26.0–34.0)
MCHC: 32 g/dL (ref 30.0–36.0)
MCV: 84.7 fL (ref 78.0–100.0)
MONO ABS: 0.9 10*3/uL (ref 0.1–1.0)
Monocytes Relative: 8 %
Neutro Abs: 6.9 10*3/uL (ref 1.7–7.7)
Neutrophils Relative %: 61 %
PLATELETS: 370 10*3/uL (ref 150–400)
RBC: 4.39 MIL/uL (ref 3.87–5.11)
RDW: 12.3 % (ref 11.5–15.5)
WBC: 11.2 10*3/uL — AB (ref 4.0–10.5)

## 2017-11-26 LAB — PREGNANCY, URINE: Preg Test, Ur: NEGATIVE

## 2017-11-26 LAB — COMPREHENSIVE METABOLIC PANEL
ALK PHOS: 95 U/L (ref 38–126)
ALT: 22 U/L (ref 14–54)
AST: 21 U/L (ref 15–41)
Albumin: 3.9 g/dL (ref 3.5–5.0)
Anion gap: 5 (ref 5–15)
BILIRUBIN TOTAL: 0.5 mg/dL (ref 0.3–1.2)
BUN: 10 mg/dL (ref 6–20)
CALCIUM: 8.9 mg/dL (ref 8.9–10.3)
CHLORIDE: 104 mmol/L (ref 101–111)
CO2: 27 mmol/L (ref 22–32)
CREATININE: 0.57 mg/dL (ref 0.44–1.00)
Glucose, Bld: 92 mg/dL (ref 65–99)
Potassium: 3.7 mmol/L (ref 3.5–5.1)
Sodium: 136 mmol/L (ref 135–145)
TOTAL PROTEIN: 7.6 g/dL (ref 6.5–8.1)

## 2017-11-26 LAB — TROPONIN I: Troponin I: <0.03 ng/mL (ref <0.03)

## 2017-11-26 LAB — D-DIMER, QUANTITATIVE (NOT AT ARMC): D DIMER QUANT: 0.28 ug{FEU}/mL (ref 0.00–0.50)

## 2017-11-26 MED ORDER — KETOROLAC TROMETHAMINE 30 MG/ML IJ SOLN
30.0000 mg | Freq: Once | INTRAMUSCULAR | Status: AC
  Administered 2017-11-26: 30 mg via INTRAVENOUS
  Filled 2017-11-26: qty 1

## 2017-11-26 MED ORDER — IBUPROFEN 600 MG PO TABS: 600.0000 mg | ORAL_TABLET | Freq: Four times a day (QID) | ORAL | 0 refills | Status: DC

## 2017-11-26 NOTE — Progress Notes (Signed)
e

## 2017-11-26 NOTE — ED Provider Notes (Signed)
[facility] EMERGENCY DEPARTMENT Provider Note   CSN: [id number] Arrival date & time: 11/26/17  1503     History   Chief Complaint Chief Complaint  Patient presents with  . Chest Pain    HPI Janice Raymond is a 32 y.o. female.  HPI   Janice Raymond is a 32 y.o. female, patient with no pertinent past medical history, presenting to the ED with chest pain for the past week. Pain is intermittent, lasts for hours at a time, "sometimes all day," 3/10, described as an "uncomfortable feeling," left chest, radiating to left arm. "Usually I don't notice it unless I'm thinking about it." Increased discomfort with lying supine. Does not change with exertion or eating.  She tried using Prilosec with no improvement.  Has not tried any other therapies. "We had a coworker die from a blood clot, so I just wanted to get checked out." Endorse some mild congestion and mild cough.  Patient was seen by PCP in this building and was sent down to the ED for further evaluation due to EKG abnormality.  Denies history of IV drug use or HIV. Denies fever/chills, cough, shortness of breath, N/V/D, diaphoresis, dizziness, trauma, or any other complaints.   Denies history of PE/DVT, hormone use, recent trauma, recent surgery, recent immobilization. Patient is not breast-feeding.  History reviewed. No pertinent past medical history.  Patient Active Problem List   Diagnosis Date Noted  . Postpartum care following vaginal delivery (8/26) 06/21/2016  . Second-degree perineal laceration, with delivery 06/21/2016  . Fatigue 11/05/2014  . GERD (gastroesophageal reflux disease) 11/05/2014  . Obesity, unspecified 12/28/2013  . LLQ pain 01/16/2013  . Routine gynecological examination 09/19/2012  . 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 NO PAST SURGERIES      OB History    Gravida Para Term Preterm AB Living   1 1 1     1    SAB TAB Ectopic Multiple Live Births   0 1       Home Medications    Prior to Admission medications   Medication Sig Start Date End Date Taking? Authorizing Provider  ibuprofen (ADVIL,MOTRIN) 600 MG tablet Take 1 tablet (600 mg total) by mouth every 6 (six) hours. 11/26/17   Arina Torry C, PA-C  omeprazole (PRILOSEC) 20 MG capsule TAKE 1 CAPSULE BY MOUTH EVERY DAY 09/21/17   Sandford Craze, NP    Family History Family History  Problem Relation Age of Onset  . Diabetes Mother   . Depression Mother   . Schizophrenia Mother   . Vitamin D deficiency Father   . Breast cancer Maternal Aunt   . Prostate cancer Paternal Uncle   . Breast cancer Maternal Aunt   . Breast cancer Maternal Aunt   . Prostate cancer Paternal Uncle     Social History Social History   Tobacco Use  . Smoking status: Never Smoker  . Smokeless tobacco: Never Used  Substance Use Topics  . Alcohol use: Yes    Alcohol/week: 4.2 oz    Types: 7 Glasses of wine per week  . Drug use: No     Allergies   Patient has no known allergies.   Review of Systems Review of Systems  Constitutional: Negative for diaphoresis, fatigue and fever.  HENT: Positive for congestion.   Respiratory: Negative for shortness of breath.   Cardiovascular: Positive for chest pain. Negative for palpitations and leg swelling.  Gastrointestinal: Negative for abdominal pain, diarrhea, nausea  and vomiting.  Musculoskeletal: Negative for back pain.  Neurological: Negative for dizziness, syncope, weakness and light-headedness.  All other systems reviewed and are negative.    Physical Exam Updated Vital Signs BP 115/89   Pulse 82   Temp 98.1 F (36.7 C) (Oral)   Resp 18   LMP 11/12/2017 (Approximate)   SpO2 100%   Physical Exam  Constitutional: She appears well-developed and well-nourished. No distress.  HENT:  Head: Normocephalic and atraumatic.  Eyes: Conjunctivae are normal.  Neck: Neck supple.  Cardiovascular: Normal rate, regular rhythm, normal heart  sounds and intact distal pulses.  Upon my initial contact with patient, her pulse rate was noted to be 106, however, in less than a minute and resolved down to 84 and stayed in the 80s throughout the rest of the interaction.  Pulmonary/Chest: Effort normal and breath sounds normal. No respiratory distress. She exhibits no tenderness.  No increased work of breathing.  Patient speaks in full sentences without difficulty.   Abdominal: Soft. There is no tenderness. There is no guarding.  Musculoskeletal: She exhibits no edema.  Full range of motion in the major joints of the left arm through the cardinal directions without pain or difficulty noted.  Lymphadenopathy:    She has no cervical adenopathy.  Neurological: She is alert.  No sensory deficits in the left upper extremity. Grip strengths equal. Strength 5/5 in the bilateral shoulders.  Skin: Skin is warm and dry. Capillary refill takes less than 2 seconds. She is not diaphoretic.  Psychiatric: She has a normal mood and affect. Her behavior is normal.  Nursing note and vitals reviewed.    ED Treatments / Results  Labs (all labs ordered are listed, but only abnormal results are displayed) Labs Reviewed  CBC WITH DIFFERENTIAL/PLATELET - Abnormal; Notable for the following components:      Result Value   WBC 11.2 (*)    Hemoglobin 11.9 (*)    All other components within normal limits  COMPREHENSIVE METABOLIC PANEL  D-DIMER, QUANTITATIVE (NOT AT [facility])  TROPONIN I  PREGNANCY, URINE    EKG  EKG Interpretation  Date/Time:  Friday November 26 2017 15:06:16 EST Ventricular Rate:  94 PR Interval:  154 QRS Duration: 78 QT Interval:  324 QTC Calculation: 405 R Axis:   76 Text Interpretation:  Normal sinus rhythm ST elevation, consider early repolarization, pericarditis, or injury Nonspecific ST and T wave abnormality No previous tracing Confirmed by Gwyneth SproutPlunkett, Whitney ([id number]) on 11/26/2017 5:15:09 PM       Radiology Dg Chest 2  View  Result Date: 11/26/2017 CLINICAL DATA:  Left-sided chest pain radiating into the left arm. EXAM: CHEST  2 VIEW COMPARISON:  None. FINDINGS: The heart size and mediastinal contours are within normal limits. Both lungs are clear. The visualized skeletal structures are unremarkable. IMPRESSION: No active cardiopulmonary disease. Electronically Signed   By: Elige KoHetal  Patel   On: 11/26/2017 17:25    Procedures Procedures (including critical care time)  Medications Ordered in ED Medications  ketorolac (TORADOL) 30 MG/ML injection 30 mg (30 mg Intravenous Given 11/26/17 1652)     Initial Impression / Assessment and Plan / ED Course  I have reviewed the triage vital signs and the nursing notes.  Pertinent labs & imaging results that were available during my care of the patient were reviewed by me and considered in my medical decision making (see chart for details).  Clinical Course as of Nov 27 1929  Fri Nov 26, 2017  1821 Discussed  results. Pain has resolved.  [SJ]    Clinical Course User Index [SJ] Anselm Pancoast, PA-C   Patient sent from her doctor's office for EKG abnormalities. Low suspicion for ACS. HEART score is 2, indicating low risk for a cardiac event. Wells criteria score is 1.5 (due to mild tachycardia at presentation), indicating low risk for PE.  No EKG changes from first EKG in the patient's doctor's office to EKG performed here in the ED.  Troponin negative.  Due to duration of patient's symptoms, I believe single troponin to be sufficient.  D-dimer negative.  EKG findings, patient description of discomfort, and pattern of discomfort consistent with suspected pericarditis.  Patient to follow-up with PCP as needed.  Cardiology follow-up should symptoms persist. The patient was given instructions for home care as well as return precautions. Patient voices understanding of these instructions, accepts the plan, and is comfortable with discharge.  Findings and plan of care discussed  with Gwyneth Sprout, MD.   Vitals:   11/26/17 1720 11/26/17 1730 11/26/17 1800 11/26/17 1830  BP:  101/74 98/70 103/66  Pulse: 85 83 87 90  Resp: 18 16 15 15   Temp:      TempSrc:      SpO2: 100% 100% 100% 100%     Final Clinical Impressions(s) / ED Diagnoses   Final diagnoses:  Chest pain, unspecified type    ED Discharge Orders        Ordered    ibuprofen (ADVIL,MOTRIN) 600 MG tablet  Every 6 hours     11/26/17 1816       Anselm Pancoast, PA-C 11/27/17 Arlean Hopping, MD 11/28/17 2157

## 2017-11-26 NOTE — Progress Notes (Addendum)
Subjective:    Patient ID: Janice Raymond, female    DOB: 01-Jan-1986, 32 y.o.   MRN: [id number]  HPI  Pt in states she had some on and off in left pectoralis area shoulder and some in her arm.   Pt states on Tuesday pain started. Noticed maybe early morning. Pt description is very vague in terms of when it started or how long it lasts. Maybe last a couple of hours today but then states lasted all day last 2 days. Today she had pain sine this morning. Pain subsided some earlier in afternoon. Then pain came back.  Pain is present now.  No pain on acitvity. No sob. No jaw pain.(some throbbing to thigh) No rash on skin. Hx of hyperlipidemia. Non smoker. No hx of heart disease in direct family members.  No working out excessively. Denies stress. No use of ocp.  Pt states dull achines left upper chest shoulder and her upper arm.  Pt states coworker 2 years older than her ha sudden then. Maybe MI.  On further discussion friend probably had pulmonary embolism.  Pt in the past pt had pain like this in the past. [place] Work up was negative.    Review of Systems  Constitutional: Negative for chills.  Respiratory: Negative for chest tightness, shortness of breath and wheezing.   Cardiovascular: Positive for chest pain. Negative for palpitations.  Gastrointestinal: Negative for abdominal pain.  Musculoskeletal: Negative for back pain, myalgias and neck stiffness.  Skin: Negative for rash.  Neurological: Negative for dizziness, seizures, weakness and headaches.  Hematological: Negative for adenopathy. Does not bruise/bleed easily.  Psychiatric/Behavioral: Negative for agitation, behavioral problems, dysphoric mood and self-injury. The patient is not nervous/anxious.        Some worry about her chest pain in light of coworker who died suddenly.   No past medical history on file.   Social History   Socioeconomic History  . Marital status: Married    Spouse name: Not on  file  . Number of children: Not on file  . Years of education: Not on file  . Highest education level: Not on file  Social Needs  . Financial resource strain: Not on file  . Food insecurity - worry: Not on file  . Food insecurity - inability: Not on file  . Transportation needs - medical: Not on file  . Transportation needs - non-medical: Not on file  Occupational History  . Not on file  Tobacco Use  . Smoking status: Never Smoker  . Smokeless tobacco: Never Used  Substance and Sexual Activity  . Alcohol use: Yes    Alcohol/week: 4.2 oz    Types: 7 Glasses of wine per week  . Drug use: No  . Sexual activity: Yes    Birth control/protection: Other-see comments    Comment: Husband had vasectomy  Other Topics Concern  . Not on file  Social History Narrative   One son born 2017   Has worked at [employer]   Enjoys sleeping, thrift shopping   Married    Past Surgical History:  Procedure Laterality Date  . NO PAST SURGERIES      Family History  Problem Relation Age of Onset  . Diabetes Mother   . Depression Mother   . Schizophrenia Mother   . Vitamin D deficiency Father   . Breast cancer Maternal Aunt   . Prostate cancer Paternal Uncle   . Breast cancer Maternal Aunt   . Breast  cancer Maternal Aunt   . Prostate cancer Paternal Uncle     No Known Allergies  Current Outpatient Medications on File Prior to Visit  Medication Sig Dispense Refill  . omeprazole (PRILOSEC) 20 MG capsule TAKE 1 CAPSULE BY MOUTH EVERY DAY 30 capsule 5   No current facility-administered medications on file prior to visit.     BP 99/68   Pulse 94   Temp 98.2 F (36.8 C) (Oral)   Resp 16   Ht 5\' 5"  (1.651 m)   Wt 183 lb 3.2 oz (83.1 kg)   LMP 11/12/2017 (Approximate)   SpO2 100%   BMI 30.49 kg/m       Objective:   Physical Exam  General Mental Status- Alert. General Appearance- Not in acute distress.   Skin General: Color- Normal Color. Moisture- Normal  Moisture.  Neck Carotid Arteries- Normal color. Moisture- Normal Moisture. No carotid bruits. No JVD.  Chest and Lung Exam Auscultation: Breath Sounds:-Normal.  Cardiovascular Auscultation:Rythm- Regular. Murmurs & Other Heart Sounds:Auscultation of the heart reveals- No Murmurs.   Neurologic Cranial Nerve exam:- CN III-XII intact(No nystagmus), symmetric smile. Strength:- 5/5 equal and symmetric strength both upper and lower extremities.  Anterior thorax-on palpation of patient's costochondral junction and left upper pectoralis region no reproducible pain on palpation.  Left shoulder on palpation of shoulder and range of motion no reproducible pain. Left arm- on palpation of patient's bicep tricep area and movement of her elbow no reproducible pain. Skin-no rash on her left upper extremity. Left thigh-upon palpation of left upper thigh no obvious pain. Assessment & Plan:  I have talked with the emergency department about your current chest pain with shoulder and arm pain that has been present for the 2 days.    In light of active pain I think he would benefit from repeat EKG and 2 sets of cardiac enzymes.  You do not have that many risk factors and your EKG looks to show sinus rhythm with minimal possible ST elevation in lead I on review.  But in a situation like this with active pain and no other obvious cause I do think workup in the ED as we approached the weekend is needed.  Please go downstairs and notify them that we sent you down for evaluation of chest pain.  Let them know that I talked with the emergency department MD.  Follow-up with [place] as determined by ED or as needed.  Esperanza Richters, PA-C

## 2017-11-26 NOTE — Discharge Instructions (Signed)
Your lab results were encouraging.  Your pain may be due to to something called pericarditis.   Antiinflammatory medications: Take 600 mg of ibuprofen every 6 hours or 440 mg (over the counter dose) to 500 mg (prescription dose) of naproxen every 12 hours for the next 3 days. After this time, these medications may be used as needed for pain. Take these medications with food to avoid upset stomach. Choose only one of these medications, do not take them together. Tylenol: Should you continue to have additional pain while taking the ibuprofen or naproxen, you may add in tylenol as needed. Your daily total maximum amount of tylenol from all sources should be limited to 4000mg /day for persons without liver problems, or 2000mg /day for those with liver problems.  Follow-up with your primary care provider in about 3 days for repeat assessment.  Should symptoms persist, make an appointment with the cardiologist to have further testing performed.  Return to ED should symptoms worsen or be combined with issues such as shortness of breath, persistent vomiting, or any other major concerns.

## 2017-11-26 NOTE — Patient Instructions (Addendum)
I have talked with the emergency department about your current chest pain with shoulder and arm pain that has been present for the 2 days.    In light of active pain I think he would benefit from repeat EKG and 2 sets of cardiac enzymes.  You do not have that many risk factors and your EKG looks to show sinus rhythm with minimal possible ST elevation in lead I on review.  But in a situation like this with active pain and no other obvious cause I do think workup in the ED as we approached the weekend is needed.  Please go downstairs and notify them that we sent you down for evaluation of chest pain.  Let them know that I talked with the emergency department MD.  Follow-up with us as determined by ED or as needed.

## 2017-11-26 NOTE — ED Notes (Signed)
Pt given Rx x 1 for ibuprofen 

## 2017-11-26 NOTE — ED Notes (Signed)
Snack given after verbal approval from [place] Joy, [place]

## 2017-11-26 NOTE — ED Notes (Signed)
ED Provider at bedside. 

## 2017-11-26 NOTE — ED Notes (Signed)
Pt on cardiac monitor and auto VS 

## 2017-11-26 NOTE — ED Triage Notes (Signed)
Patient reports sent from PCP upstairs for abnormal EKG and chest pain x 2 days.  Denies n/v, shortness of breath.  Reports the pain as intermittent and a dull ache.

## 2017-11-30 ENCOUNTER — Telehealth: Payer: Self-pay | Admitting: *Deleted

## 2017-11-30 ENCOUNTER — Encounter: Payer: Self-pay | Admitting: Family

## 2017-11-30 ENCOUNTER — Ambulatory Visit: Payer: BLUE CROSS/BLUE SHIELD | Admitting: Family

## 2017-11-30 VITALS — BP 109/81 | HR 89 | Temp 98.2°F | Resp 16 | Ht 65.0 in | Wt 182.0 lb

## 2017-11-30 DIAGNOSIS — I319 Disease of pericardium, unspecified: Secondary | ICD-10-CM | POA: Diagnosis not present

## 2017-11-30 DIAGNOSIS — J029 Acute pharyngitis, unspecified: Secondary | ICD-10-CM | POA: Diagnosis not present

## 2017-11-30 LAB — CBC WITH DIFFERENTIAL/PLATELET
BASOS PCT: 0.8 % (ref 0.0–3.0)
Basophils Absolute: 0.1 10*3/uL (ref 0.0–0.1)
EOS PCT: 1.3 % (ref 0.0–5.0)
Eosinophils Absolute: 0.2 10*3/uL (ref 0.0–0.7)
HEMATOCRIT: 36.7 % (ref 36.0–46.0)
HEMOGLOBIN: 11.7 g/dL — AB (ref 12.0–15.0)
Lymphocytes Relative: 26.9 % (ref 12.0–46.0)
Lymphs Abs: 3.1 10*3/uL (ref 0.7–4.0)
MCHC: 31.9 g/dL (ref 30.0–36.0)
MCV: 84.5 fl (ref 78.0–100.0)
MONO ABS: 2.1 10*3/uL — AB (ref 0.1–1.0)
Monocytes Relative: 18.5 % — ABNORMAL HIGH (ref 3.0–12.0)
Neutro Abs: 6.1 10*3/uL (ref 1.4–7.7)
Neutrophils Relative %: 52.5 % (ref 43.0–77.0)
Platelets: 294 10*3/uL (ref 150.0–400.0)
RBC: 4.34 Mil/uL (ref 3.87–5.11)
RDW: 12.9 % (ref 11.5–15.5)
WBC: 11.6 10*3/uL — AB (ref 4.0–10.5)

## 2017-11-30 LAB — POCT RAPID STREP A (OFFICE): RAPID STREP A SCREEN: POSITIVE — AB

## 2017-11-30 LAB — SEDIMENTATION RATE: Sed Rate: 17 mm/hr (ref 0–20)

## 2017-11-30 MED ORDER — CEFTRIAXONE SODIUM 250 MG IJ SOLR: 250.0000 mg | Freq: Once | INTRAMUSCULAR | Status: DC

## 2017-11-30 MED ORDER — CEFTRIAXONE SODIUM 1 G IJ SOLR
1.0000 g | Freq: Once | INTRAMUSCULAR | Status: AC
  Administered 2017-11-30: 1 g via INTRAMUSCULAR

## 2017-11-30 MED ORDER — AMOXICILLIN-POT CLAVULANATE 875-125 MG PO TABS: 1.0000 | ORAL_TABLET | Freq: Two times a day (BID) | ORAL | 0 refills | Status: DC

## 2017-11-30 NOTE — Progress Notes (Signed)
Subjective:    Patient ID: Janice Raymond, female    DOB: Jan 13, 1986, 32 y.o.   MRN: [id number]  HPI  Patient is a 32 yr old female who presents today for ED follow up.  Emergency room record is reviewed from November 26, 2017.  At that time she described chest pain which been present for 1 week and was intermittent in nature.  ER workup included a negative troponin, negative d-dimer, normal comprehensive metabolic panel, and mild anemia with a hemoglobin of 11.9.  She also had a mild leukocytosis with white count of 11.2 K.  The emergency department felt that her symptoms were most consistent with early pericarditis.  She reports improvement in her chest pain with ibuprofen. Reports that she developed viral symptoms about 2 weeks ago. Dog died on the Jan 20, 2023. Had a cold the whole time. Felt like theraflu was helping. At night on Saturday had chills but no fever.  (but was taking ibuprofen).  Had myalgia last night. Had negative flu swab at a work.    Review of Systems See HPI  No past medical history on file.   Social History   Socioeconomic History  . Marital status: Married    Spouse name: Not on file  . Number of children: Not on file  . Years of education: Not on file  . Highest education level: Not on file  Social Needs  . Financial resource strain: Not on file  . Food insecurity - worry: Not on file  . Food insecurity - inability: Not on file  . Transportation needs - medical: Not on file  . Transportation needs - non-medical: Not on file  Occupational History  . Not on file  Tobacco Use  . Smoking status: Never Smoker  . Smokeless tobacco: Never Used  Substance and Sexual Activity  . Alcohol use: Yes    Alcohol/week: 4.2 oz    Types: 7 Glasses of wine per week  . Drug use: No  . Sexual activity: Yes    Birth control/protection: Other-see comments    Comment: Husband had vasectomy  Other Topics Concern  . Not on file  Social History Narrative   One son born 01-21-16   Has worked at [employer]   Enjoys sleeping, thrift shopping   Married    Past Surgical History:  Procedure Laterality Date  . NO PAST SURGERIES      Family History  Problem Relation Age of Onset  . Diabetes Mother   . Depression Mother   . Schizophrenia Mother   . Vitamin D deficiency Father   . Breast cancer Maternal Aunt   . Prostate cancer Paternal Uncle   . Breast cancer Maternal Aunt   . Breast cancer Maternal Aunt   . Prostate cancer Paternal Uncle     No Known Allergies  Current Outpatient Medications on File Prior to Visit  Medication Sig Dispense Refill  . ibuprofen (ADVIL,MOTRIN) 600 MG tablet Take 1 tablet (600 mg total) by mouth every 6 (six) hours. 30 tablet 0  . omeprazole (PRILOSEC) 20 MG capsule TAKE 1 CAPSULE BY MOUTH EVERY DAY 30 capsule 5   No current facility-administered medications on file prior to visit.     BP 109/81 (BP Location: Left Arm, Patient Position: Sitting, Cuff Size: Large)   Pulse 89   Temp 98.2 F (36.8 C) (Oral)   Resp 16   Ht '5\' 5"'  (1.651 m)   Wt 182 lb (82.6 kg)   LMP 11/12/2017 (Approximate)  SpO2 97%   BMI 30.29 kg/m       Objective:   Physical Exam  Constitutional: She is oriented to person, place, and time. She appears well-developed and well-nourished.  HENT:  2+ bilateral tonsils, + exudates bilaterally L>R  Cardiovascular: Normal rate, regular rhythm and normal heart sounds. Exam reveals no friction rub.  No murmur heard. Pulmonary/Chest: Effort normal and breath sounds normal. No respiratory distress. She has no wheezes.  Musculoskeletal: She exhibits no edema.  Lymphadenopathy:    She has no cervical adenopathy.  Neurological: She is alert and oriented to person, place, and time.  Skin: Skin is warm and dry.  Psychiatric: She has a normal mood and affect. Her behavior is normal. Judgment and thought content normal.          Assessment & Plan:  Pericarditis- rapid strep faintly positive.  Concerning for strep pericarditis.  Will obtain 2D echo ASAP, cardiology consult ASAP, IM cetriaxone 1g given today in clinic, to be  Followed by augmentin. Continue ibuprofen. Prn.  Check ESR, HIV, ANA/DS DNA.   EKG is performed and personally reviewed and compared to EKG from the ED. Again notes ST elevation. She is advised to go to the emergency room if she develop worsening chest pain, palpitations, shortness of breath, or fever greater than 101. I have reviewed this case and plan with Dr. Charlett Blake.

## 2017-11-30 NOTE — Patient Instructions (Signed)
Please begin Augmentin for strep infection. you will be contacted about your referral for echocardiogram as well as to see the cardiologist. You may continue ibuprofen as needed for chest pain. Please complete lab work prior to leaving. Please go to the emergency room if you develop worsening chest pain, palpitations, shortness of breath, or fever greater than 101.

## 2017-11-30 NOTE — Telephone Encounter (Signed)
Scheduled echo for 12/01/17 at 8am at [facility], register in admitting at 7:45am [facility]   Scheduled consult with Dr Tomie Chinaevankar on 12/01/17 at 11:20am at the [facility], pt to arrive at 11am. Pt has been notified and is agreeable to proceed with both appts.

## 2017-11-30 NOTE — Addendum Note (Signed)
Addended by: Steve RattlerBLEVINS, [name] A on: 11/30/2017 11:49 AM   Modules accepted: Orders

## 2017-12-01 ENCOUNTER — Telehealth: Payer: Self-pay | Admitting: Family

## 2017-12-01 ENCOUNTER — Ambulatory Visit: Payer: BLUE CROSS/BLUE SHIELD | Admitting: Cardiology

## 2017-12-01 ENCOUNTER — Ambulatory Visit (HOSPITAL_COMMUNITY)
Admission: RE | Admit: 2017-12-01 | Discharge: 2017-12-01 | Disposition: A | Discharge status: Home / Self Care | Payer: BLUE CROSS/BLUE SHIELD | Source: Ambulatory Visit | Attending: Family | Admitting: Family

## 2017-12-01 ENCOUNTER — Encounter: Payer: Self-pay | Admitting: Cardiology

## 2017-12-01 ENCOUNTER — Encounter: Payer: Self-pay | Admitting: Family

## 2017-12-01 DIAGNOSIS — I309 Acute pericarditis, unspecified: Secondary | ICD-10-CM | POA: Diagnosis not present

## 2017-12-01 DIAGNOSIS — I319 Disease of pericardium, unspecified: Secondary | ICD-10-CM

## 2017-12-01 DIAGNOSIS — I253 Aneurysm of heart: Secondary | ICD-10-CM | POA: Insufficient documentation

## 2017-12-01 HISTORY — DX: Acute pericarditis, unspecified: I30.9

## 2017-12-01 LAB — HIV ANTIBODY (ROUTINE TESTING W REFLEX): HIV 1&2 Ab, 4th Generation: NONREACTIVE

## 2017-12-01 NOTE — Progress Notes (Signed)
  Echocardiogram 2D Echocardiogram has been performed.  Delcie RochENNINGTON, Joushua Dugar 12/01/2017, 8:57 AM

## 2017-12-01 NOTE — Progress Notes (Signed)
Cardiology Office Note:    Date:  12/01/2017   ID:  Janice Raymond, DOB August 28, 1986, MRN [id number]  PCP:  Janice Raymond, Melissa, NP  Cardiologist:  Garwin Brothersajan R Cathlene Gardella, MD   Referring MD: Janice Raymond, Melissa, NP    ASSESSMENT:    1. Acute pericarditis, unspecified type    PLAN:    In order of problems listed above:  1. Patient had what seems like an upper respiratory tract infection, strep throat and also symptoms suggestive of pericarditis.  Her EKG also was suggestive.  She has received appropriate treatment for this.  Currently she has no clinical findings of pericarditis at this time.  Her chest pain has completely resolved.  She took a deep breath during my evaluation and also changes of posture did not elicit a pericardial rub or a pleural rub.  She had no chest pain during my evaluation today.  It appears that therapy is working for her and asked her to continue this.  Echocardiogram report was also discussed.  She will be seen in follow-up appointment in a week or earlier if she has any concerns patient had multiple questions which were answered to her satisfaction today.  Her echocardiogram visit was within normal limits also.   Medication Adjustments/Labs and Tests Ordered: Current medicines are reviewed at length with the patient today.  Concerns regarding medicines are outlined above.  No orders of the defined types were placed in this encounter.  No orders of the defined types were placed in this encounter.    History of Present Illness:    Janice Raymond is a 32 y.o. female who is being seen today for the evaluation of chest pain at the request of Janice Raymond, Melissa, NP.  Patient is a pleasant 32 year old female.  She is otherwise a healthy lady with no significant past medical history.  She mentions to me that she saw her physician and also went to the emergency room with chest pain she was found to have a strep throat and treated with antibiotics.  She also was given a  diagnosis of pericarditis based on her chest pain symptoms and EKG findings and was recommended nonsteroidal anti-inflammatory therapy with which she has significantly improved.  For this reason she was sent here for an evaluation.  At the time of my evaluation, the patient is alert awake oriented and in no distress.  She does not exercise on a regular basis.  She leads a sedentary lifestyle.  She is married and has a 33-year-old child.  History reviewed. No pertinent past medical history.  Past Surgical History:  Procedure Laterality Date  . NO PAST SURGERIES      Current Medications: Current Meds  Medication Sig  . amoxicillin-clavulanate (AUGMENTIN) 875-125 MG tablet Take 1 tablet by mouth 2 (two) times daily.  Marland Kitchen. ibuprofen (ADVIL,MOTRIN) 600 MG tablet Take 1 tablet (600 mg total) by mouth every 6 (six) hours.  Marland Kitchen. omeprazole (PRILOSEC) 20 MG capsule TAKE 1 CAPSULE BY MOUTH EVERY DAY     Allergies:   Patient has no known allergies.   Social History   Socioeconomic History  . Marital status: Married    Spouse name: None  . Number of children: None  . Years of education: None  . Highest education level: None  Social Needs  . Financial resource strain: None  . Food insecurity - worry: None  . Food insecurity - inability: None  . Transportation needs - medical: None  . Transportation needs - non-medical: None  Occupational History  .  None  Tobacco Use  . Smoking status: Never Smoker  . Smokeless tobacco: Never Used  Substance and Sexual Activity  . Alcohol use: Yes    Alcohol/week: 4.2 oz    Types: 7 Glasses of wine per week  . Drug use: No  . Sexual activity: Yes    Birth control/protection: Other-see comments    Comment: Husband had vasectomy  Other Topics Concern  . None  Social History Narrative   One son born 2017   Has worked at [employer]   Enjoys sleeping, thrift shopping   Married     Family History: The patient's family history includes Breast  cancer in her maternal aunt, maternal aunt, and maternal aunt; Depression in her mother; Diabetes in her mother; Prostate cancer in her paternal uncle and paternal uncle; Schizophrenia in her mother; Vitamin D deficiency in her father.  ROS:   Please see the history of present illness.    All other systems reviewed and are negative.  EKGs/Labs/Other Studies Reviewed:    The following studies were reviewed today: I reviewed emergency room records, lab work and EKG and therefore features of pericarditis on that EKG.   Recent Labs: 02/08/2017: TSH 0.96 11/26/2017: ALT 22; BUN 10; Creatinine, Ser 0.57; Potassium 3.7; Sodium 136 11/30/2017: Hemoglobin 11.7; Platelets 294.0  Recent Lipid Panel    Component Value Date/Time   CHOL 184 02/08/2017 1619   TRIG 155.0 (H) 02/08/2017 1619   HDL 72.40 02/08/2017 1619   CHOLHDL 3 02/08/2017 1619   VLDL 31.0 02/08/2017 1619   LDLCALC 80 02/08/2017 1619   LDLDIRECT 95.3 09/19/2012 1116    Physical Exam:    VS:  BP 128/72 (BP Location: Right Arm, Patient Position: Sitting, Cuff Size: Normal)   Pulse 80   Ht 5\' 5"  (1.651 m)   Wt 184 lb 6.4 oz (83.6 kg)   LMP 11/12/2017 (Approximate)   SpO2 98%   BMI 30.69 kg/m     Wt Readings from Last 3 Encounters:  12/01/17 184 lb 6.4 oz (83.6 kg)  11/30/17 182 lb (82.6 kg)  11/26/17 183 lb 3.2 oz (83.1 kg)     GEN: Patient is in no acute distress HEENT: Normal NECK: No JVD; No carotid bruits LYMPHATICS: No lymphadenopathy CARDIAC: S1 S2 regular, 2/6 systolic murmur at the apex. RESPIRATORY:  Clear to auscultation without rales, wheezing or rhonchi  ABDOMEN: Soft, non-tender, non-distended MUSCULOSKELETAL:  No edema; No deformity  SKIN: Warm and dry NEUROLOGIC:  Alert and oriented x 3 PSYCHIATRIC:  Normal affect    Signed, Garwin Brothers, MD  12/01/2017 10:57 AM    [facility]

## 2017-12-01 NOTE — Telephone Encounter (Unsigned)
Copied from CRM [phone]. Topic: Quick Communication - See Telephone Encounter >> Dec 01, 2017  4:14 PM Floria RavelingStovall, Shana A wrote: CRM for notification. See Telephone encounter for: pt called in with a few questions.   1- she would like someone to call when lab results are resulted  2- she would like to talk to nurse about Echo results 3- should the the antibiotic be hurting her stomach as bad as it is making it hurt and she is contently going to the restroom   Best number [phone]  12/01/17.

## 2017-12-01 NOTE — Telephone Encounter (Signed)
Copied from CRM [phone]. Topic: Quick Communication - See Telephone Encounter >> Dec 01, 2017 11:50 AM Eston Mouldavis, Torri Langston B wrote: CRM for notification. See Telephone encounter for:  PT is requesting lab results 12/01/17.

## 2017-12-01 NOTE — Telephone Encounter (Signed)
Please let patient know lab work is still pending.  I do have back her HIV screen which was negative, CBC which shows mild elevation of her white blood cell count.  This is consistent with her strep infection.  Autoimmune testing is still pending.  I will send it to her via my chart when complete.  Echocardiogram look good.

## 2017-12-01 NOTE — Patient Instructions (Signed)
Medication Instructions:  Your physician recommends that you continue on your current medications as directed. Please refer to the Current Medication list given to you today.  Labwork: None  Testing/Procedures: None  Follow-Up: Your physician recommends that you schedule a follow-up appointment in: 1 week  Any Other Special Instructions Will Be Listed Below (If Applicable).     If you need a refill on your cardiac medications before your next appointment, please call your pharmacy.   [facility]  Garey HamAshley A, RN, BSN

## 2017-12-01 NOTE — Telephone Encounter (Signed)
Left detailed message re: below results and to be checking mychart for final results from PCP.

## 2017-12-01 NOTE — Telephone Encounter (Signed)
First 2 questions addressed in previous phone note and detailed message was left for pt on her voicemail. Advised her to send message via mychart to clarify if frequent bathroom trips is due to frequent urination or frequent loose stools? Awaiting response from pt.

## 2017-12-02 LAB — ANA+ENA+DNA/DS+ANTICH+CENTR
ANA Titer 1: NEGATIVE
Centromere Ab Screen: <0.2 AI (ref 0.0–0.9)
Chromatin Ab SerPl-aCnc: <0.2 AI (ref 0.0–0.9)
DSDNA AB: 2 [IU]/mL (ref 0–9)
ENA RNP Ab: 0.4 AI (ref 0.0–0.9)
ENA SM Ab Ser-aCnc: <0.2 AI (ref 0.0–0.9)

## 2017-12-02 MED ORDER — CEFDINIR 300 MG PO CAPS
300.0000 mg | ORAL_CAPSULE | Freq: Two times a day (BID) | ORAL | 0 refills | Status: DC
Start: 2017-12-02 — End: 2018-03-01

## 2017-12-02 NOTE — Telephone Encounter (Signed)
See my chart message

## 2017-12-03 ENCOUNTER — Telehealth: Payer: Self-pay | Admitting: *Deleted

## 2017-12-03 ENCOUNTER — Encounter: Payer: Self-pay | Admitting: Family

## 2017-12-03 NOTE — Telephone Encounter (Signed)
Received Lab Report results from [facility]; forwarded to provider/SLS 02/08

## 2017-12-05 MED ORDER — FLUCONAZOLE 150 MG PO TABS: 150.0000 mg | ORAL_TABLET | Freq: Once | ORAL | 0 refills | Status: AC

## 2017-12-07 ENCOUNTER — Ambulatory Visit: Payer: BLUE CROSS/BLUE SHIELD | Admitting: Cardiology

## 2017-12-07 ENCOUNTER — Ambulatory Visit: Payer: BLUE CROSS/BLUE SHIELD | Admitting: Family

## 2018-03-01 ENCOUNTER — Encounter: Payer: Self-pay | Admitting: Family

## 2018-03-01 ENCOUNTER — Ambulatory Visit: Payer: BLUE CROSS/BLUE SHIELD | Admitting: Family

## 2018-03-01 VITALS — BP 108/77 | HR 88 | Temp 98.6°F | Resp 16 | Ht 66.0 in | Wt 189.4 lb

## 2018-03-01 DIAGNOSIS — R079 Chest pain, unspecified: Secondary | ICD-10-CM

## 2018-03-01 LAB — CBC WITH DIFFERENTIAL/PLATELET
BASOS PCT: 0.6 % (ref 0.0–3.0)
Basophils Absolute: 0 10*3/uL (ref 0.0–0.1)
EOS ABS: 0.1 10*3/uL (ref 0.0–0.7)
Eosinophils Relative: 2.1 % (ref 0.0–5.0)
HCT: 38.4 % (ref 36.0–46.0)
Hemoglobin: 12.4 g/dL (ref 12.0–15.0)
LYMPHS ABS: 2.2 10*3/uL (ref 0.7–4.0)
Lymphocytes Relative: 40.3 % (ref 12.0–46.0)
MCHC: 32.2 g/dL (ref 30.0–36.0)
MCV: 84.5 fl (ref 78.0–100.0)
MONO ABS: 0.5 10*3/uL (ref 0.1–1.0)
Monocytes Relative: 9.3 % (ref 3.0–12.0)
NEUTROS ABS: 2.6 10*3/uL (ref 1.4–7.7)
NEUTROS PCT: 47.7 % (ref 43.0–77.0)
PLATELETS: 279 10*3/uL (ref 150.0–400.0)
RBC: 4.54 Mil/uL (ref 3.87–5.11)
RDW: 13.4 % (ref 11.5–15.5)
WBC: 5.5 10*3/uL (ref 4.0–10.5)

## 2018-03-01 LAB — SEDIMENTATION RATE: SED RATE: 4 mm/h (ref 0–20)

## 2018-03-01 LAB — C-REACTIVE PROTEIN: CRP: 0.4 mg/dL — ABNORMAL LOW (ref 0.5–20.0)

## 2018-03-01 MED ORDER — MELOXICAM 7.5 MG PO TABS: 7.5000 mg | ORAL_TABLET | Freq: Every day | ORAL | 0 refills | Status: DC

## 2018-03-01 NOTE — Progress Notes (Signed)
Subjective:    Patient ID: Janice Raymond, female    DOB: 04/22/1986, 32 y.o.   MRN: [id number]  HPI  [name] is a 32 yr old female who presents today for follow up.  We last saw her on November 30, 2017 following an ER visit for pericarditis.  She was positive for strep at that time.  Antibiotics were initiated.  She was advised to follow-up in 1 week.  We have not seen her back until today.  Lab work was done at that time which included lupus screening, HIV screening and sed rate.  These lab tests were all within normal limits.  CBC noted a mild leukocytosis with a white count of 11.6.  Her echocardiogram noted normal left ventricular ejection fraction, normal diastolic dysfunction.  An atrial septal aneurysm was noted. She was referred to cardiology.  They felt that she was clinically stable and had received appropriate treatment.  They recommended a one-week follow-up appointment.  Patient did not return.  She reports that she has been having "chest problems "on and off." Reports that she has intermittent chest discomfort. Not worse with activity. She report that this began last week. She denies fever or shortness of breath swelling or cough. Symptoms improve with motrin.   Review of Systems    see HPI  No past medical history on file.   Social History   Socioeconomic History  . Marital status: Married    Spouse name: Not on file  . Number of children: Not on file  . Years of education: Not on file  . Highest education level: Not on file  Occupational History  . Not on file  Social Needs  . Financial resource strain: Not on file  . Food insecurity:    Worry: Not on file    Inability: Not on file  . Transportation needs:    Medical: Not on file    Non-medical: Not on file  Tobacco Use  . Smoking status: Never Smoker  . Smokeless tobacco: Never Used  Substance and Sexual Activity  . Alcohol use: Yes    Alcohol/week: 4.2 oz    Types: 7 Glasses of wine per week  . Drug  use: No  . Sexual activity: Yes    Birth control/protection: Other-see comments    Comment: Husband had vasectomy  Lifestyle  . Physical activity:    Days per week: Not on file    Minutes per session: Not on file  . Stress: Not on file  Relationships  . Social connections:    Talks on phone: Not on file    Gets together: Not on file    Attends religious service: Not on file    Active member of club or organization: Not on file    Attends meetings of clubs or organizations: Not on file    Relationship status: Not on file  . Intimate partner violence:    Fear of current or ex partner: Not on file    Emotionally abused: Not on file    Physically abused: Not on file    Forced sexual activity: Not on file  Other Topics Concern  . Not on file  Social History Narrative   One son born 2017   Has worked at [employer]   Enjoys sleeping, thrift shopping   Married    Past Surgical History:  Procedure Laterality Date  . NO PAST SURGERIES      Family History  Problem Relation Age of Onset  .  Diabetes Mother   . Depression Mother   . Schizophrenia Mother   . Vitamin D deficiency Father   . Breast cancer Maternal Aunt   . Prostate cancer Paternal Uncle   . Breast cancer Maternal Aunt   . Breast cancer Maternal Aunt   . Prostate cancer Paternal Uncle     No Known Allergies  Current Outpatient Medications on File Prior to Visit  Medication Sig Dispense Refill  . ibuprofen (ADVIL,MOTRIN) 600 MG tablet Take 1 tablet (600 mg total) by mouth every 6 (six) hours. 30 tablet 0  . omeprazole (PRILOSEC) 20 MG capsule TAKE 1 CAPSULE BY MOUTH EVERY DAY 30 capsule 5   No current facility-administered medications on file prior to visit.     BP 108/77 (BP Location: Right Arm, Patient Position: Sitting, Cuff Size: Large)   Pulse 88   Temp 98.6 F (37 C) (Oral)   Resp 16   Ht '5\' 6"'  (1.676 m)   Wt 189 lb 6.4 oz (85.9 kg)   LMP 02/26/2018   SpO2 97%   BMI 30.57 kg/m     Objective:   Physical Exam  Constitutional: She is oriented to person, place, and time. She appears well-developed and well-nourished.  Cardiovascular: Normal rate, regular rhythm, S1 normal, S2 normal, normal heart sounds and normal pulses. Exam reveals no friction rub.  No murmur heard. Pulmonary/Chest: Effort normal and breath sounds normal. No respiratory distress. She has no wheezes.  Neurological: She is alert and oriented to person, place, and time.  Psychiatric: She has a normal mood and affect. Her behavior is normal. Judgment and thought content normal.          Assessment & Plan:  Chest pain- pt has hx of pericarditis. I reviewed ekg with cardiology- Dr. Agustin Cree.  He did not feel that there were any acute changes other than some early repolarization. He suggested that we check CRP and ESR to rule out pericarditis.  Will also order CBC.  I have added meloxicam once daily as needed and have advised pt to go to the ER if you develop worsening chest pain or shortness of breath. We will work on scheduling a follow up appointment with the cardiologist.

## 2018-03-01 NOTE — Patient Instructions (Signed)
Please complete lab work prior to leaving. You may begin meloxicam once daily as needed. Do not take with ibuprofen. Go to the ER if you develop worsening chest pain or shortness of breath.  We will work on scheduling a follow up appointment with the cardiologist.

## 2018-03-02 ENCOUNTER — Encounter: Payer: Self-pay | Admitting: Family

## 2018-03-03 ENCOUNTER — Ambulatory Visit: Payer: Self-pay | Admitting: *Deleted

## 2018-03-03 MED ORDER — OMEPRAZOLE 40 MG PO CPDR: 40.0000 mg | DELAYED_RELEASE_CAPSULE | Freq: Every day | ORAL | 3 refills | Status: DC

## 2018-03-03 NOTE — Telephone Encounter (Signed)
Pt called to report chest pain unchanged since seen 03/01/18. States pain "No worse" denies any other symptoms, no new symptoms. Questioning if could be related to her acid reflux; on Prilosec. Also states motrin has helped pain better than Mobic rx. Pt requesting an earlier appt with cardiologist if possible.  Please advise: [phone]  Reason for Disposition . [1] Follow-up call from patient regarding patient's clinical status AND [2] information NON-URGENT  Answer Assessment - Initial Assessment Questions 1. REASON FOR CALL or QUESTION: "What is your reason for calling today?" or "How can I best help you?" or "What question do you have that I can help answer?"    Intermittent chest pain remains; no worse, no other symptoms 2. CALLER: Document the source of call. (e.g., laboratory, patient).     Patient  Protocols used: PCP CALL - NO TRIAGE-A-AH

## 2018-03-04 ENCOUNTER — Encounter: Payer: Self-pay | Admitting: Family

## 2018-03-04 ENCOUNTER — Ambulatory Visit: Payer: BLUE CROSS/BLUE SHIELD | Admitting: Family

## 2018-03-04 VITALS — BP 112/73 | HR 87 | Temp 98.8°F | Ht 60.0 in | Wt 183.4 lb

## 2018-03-04 DIAGNOSIS — J029 Acute pharyngitis, unspecified: Secondary | ICD-10-CM

## 2018-03-04 DIAGNOSIS — J039 Acute tonsillitis, unspecified: Secondary | ICD-10-CM | POA: Diagnosis not present

## 2018-03-04 LAB — POCT RAPID STREP A (OFFICE): RAPID STREP A SCREEN: NEGATIVE

## 2018-03-04 MED ORDER — AMOXICILLIN 500 MG PO CAPS: 500.0000 mg | ORAL_CAPSULE | Freq: Three times a day (TID) | ORAL | 0 refills | Status: DC

## 2018-03-04 NOTE — Progress Notes (Signed)
Subjective:    Patient ID: Janice Raymond, female    DOB: [birth year], 32 y.o.   MRN: [id number]  HPI   Janice Raymond is a 32 yr old female who presents today with chief complaint of sore throat. Reports sore throat began last night, no associated fever. +sneezing but no nasal drainage. Reports sore throat is 4/10. She is scheduled for tonsillectomy on 03/28/18 with Dr. Haroldine Laws. Reports throat is more sore on the left than the right   Review of Systems    see HPI  No past medical history on file.   Social History   Socioeconomic History  . Marital status: Married    Spouse name: Not on file  . Number of children: Not on file  . Years of education: Not on file  . Highest education level: Not on file  Occupational History  . Not on file  Social Needs  . Financial resource strain: Not on file  . Food insecurity:    Worry: Not on file    Inability: Not on file  . Transportation needs:    Medical: Not on file    Non-medical: Not on file  Tobacco Use  . Smoking status: Never Smoker  . Smokeless tobacco: Never Used  Substance and Sexual Activity  . Alcohol use: Yes    Alcohol/week: 4.2 oz    Types: 7 Glasses of wine per week  . Drug use: No  . Sexual activity: Yes    Birth control/protection: Other-see comments    Comment: Husband had vasectomy  Lifestyle  . Physical activity:    Days per week: Not on file    Minutes per session: Not on file  . Stress: Not on file  Relationships  . Social connections:    Talks on phone: Not on file    Gets together: Not on file    Attends religious service: Not on file    Active member of club or organization: Not on file    Attends meetings of clubs or organizations: Not on file    Relationship status: Not on file  . Intimate partner violence:    Fear of current or ex partner: Not on file    Emotionally abused: Not on file    Physically abused: Not on file    Forced sexual activity: Not on file  Other Topics Concern  . Not on  file  Social History Narrative   One son born 2017   Has worked at [employer]   Enjoys sleeping, thrift shopping   Married    Past Surgical History:  Procedure Laterality Date  . NO PAST SURGERIES      Family History  Problem Relation Age of Onset  . Diabetes Mother   . Depression Mother   . Schizophrenia Mother   . Vitamin D deficiency Father   . Breast cancer Maternal Aunt   . Prostate cancer Paternal Uncle   . Breast cancer Maternal Aunt   . Breast cancer Maternal Aunt   . Prostate cancer Paternal Uncle     No Known Allergies  Current Outpatient Medications on File Prior to Visit  Medication Sig Dispense Refill  . ibuprofen (ADVIL,MOTRIN) 600 MG tablet Take 1 tablet (600 mg total) by mouth every 6 (six) hours. 30 tablet 0  . meloxicam (MOBIC) 7.5 MG tablet Take 1 tablet (7.5 mg total) by mouth daily. 14 tablet 0  . omeprazole (PRILOSEC) 20 MG capsule TAKE 1 CAPSULE BY MOUTH EVERY DAY 30 capsule 5  .  omeprazole (PRILOSEC) 40 MG capsule Take 1 capsule (40 mg total) by mouth daily. 30 capsule 3   No current facility-administered medications on file prior to visit.     BP 112/73 (BP Location: Right Arm, Patient Position: Sitting, Cuff Size: Small)   Pulse 87   Temp 98.8 F (37.1 C) (Oral)   Ht 5' (1.524 m)   Wt 183 lb 6.4 oz (83.2 kg)   LMP 02/26/2018   HC 6" (15.2 cm)   SpO2 (!) 16%   PF 100 L/min   BMI 35.82 kg/m    Objective:   Physical Exam  Constitutional: She appears well-developed and well-nourished.  HENT:  Right Ear: Ear canal normal.  Left Ear: Ear canal normal.  Bilateral serous effusions, no erythema  Bilateral tonsillar exudates L>R  Cardiovascular: Normal rate, regular rhythm and normal heart sounds.  No murmur heard. Pulmonary/Chest: Effort normal and breath sounds normal. No respiratory distress. She has no wheezes.  Psychiatric: She has a normal mood and affect. Her behavior is normal. Judgment and thought content normal.           Assessment & Plan:  Tonsillitis- rapid strep negative but based on hx of strep and + exudates will go ahead and treat empirically with amoxicillin. She is advised to call if new/worsening symptoms or if symptoms do not improve in 2-3 days.

## 2018-03-04 NOTE — Telephone Encounter (Signed)
Reviewed with pt at visit

## 2018-03-04 NOTE — Telephone Encounter (Signed)
Janice Raymond-- it looks like pt was seen in the office today. Wasn't sure if she mentioned this to you or not? What do we need to do from here?

## 2018-03-04 NOTE — Patient Instructions (Signed)
Please begin amoxicillin for possible strep throat. Call if new/worsening symptoms or if not improved in 2-3 days.

## 2018-03-07 ENCOUNTER — Ambulatory Visit: Payer: Self-pay

## 2018-03-07 NOTE — Telephone Encounter (Signed)
Patient called in with c/o "sore throat." She says "my throat is still sore and my left tonsil is still swollen. Will this cause me to snore and wake up gasping for air? This happened this past weekend. I wonder if I have sleep apnea." I asked if her sore throat is any better than it was when she saw the provider on 03/04/18, she says "yes, it's getting better. I can eat and drink. It only hurts when I swallow cold water. My left tonsil is swollen, but no pus is on it. I don't have fevers." According to protocol, home care advice given, patient verbalized understanding. Advised if her throat is still sore on Wednesday, to call the office back for an appointment or call the ENT, she verbalized understanding.  Reason for Disposition . [1] Reasonable improvement on antibiotics AND [2] no fever  Answer Assessment - Initial Assessment Questions 1. ANTIBIOTIC: "What antibiotic are you receiving?" "How many times per day?"     Amoxicillin 3 times/day 2. ONSET: "When was the antibiotic started?"     Friday, 03/04/18 3. SEVERITY: "How bad is the sore throat?"    - MILD: doesn't interfere with eating    - MODERATE: interferes with eating some solids    - SEVERE: can't swallow liquids; drooling      Mild 4. FEVER: "Do you have a fever?" If so, ask: "What is your temperature, how was it measured, and when did it start?"     No 5. SYMPTOMS: "Are there any other symptoms you're concerned about?" If so, ask: "When did it start?"     Snoring and waking up gasping for air, chest still hurt 6. PREGNANCY: "Is there any chance you are pregnant?" "When was your last menstrual period?"     No  Protocols used: STREP THROAT INFECTION ON ANTIBIOTIC FOLLOW-UP CALL-A-AH

## 2018-03-08 ENCOUNTER — Encounter: Payer: Self-pay | Admitting: Family

## 2018-03-08 MED ORDER — FLUCONAZOLE 150 MG PO TABS: ORAL_TABLET | ORAL | 0 refills | Status: DC

## 2018-03-17 ENCOUNTER — Ambulatory Visit: Payer: BLUE CROSS/BLUE SHIELD | Admitting: Cardiology

## 2018-03-17 ENCOUNTER — Encounter: Payer: Self-pay | Admitting: Cardiology

## 2018-03-17 VITALS — BP 124/72 | HR 95 | Ht 61.0 in | Wt 182.1 lb

## 2018-03-17 DIAGNOSIS — Z8679 Personal history of other diseases of the circulatory system: Secondary | ICD-10-CM | POA: Insufficient documentation

## 2018-03-17 DIAGNOSIS — I309 Acute pericarditis, unspecified: Secondary | ICD-10-CM

## 2018-03-17 HISTORY — DX: Personal history of other diseases of the circulatory system: Z86.79

## 2018-03-17 NOTE — Progress Notes (Signed)
Cardiology Office Note:    Date:  03/17/2018   ID:  Janice Raymond, DOB 1986/06/21, MRN [id number]  PCP:  Sandford Craze, NP  Cardiologist:  Garwin Brothers, MD   Referring MD: Sandford Craze, NP    ASSESSMENT:    No diagnosis found. PLAN:    In order of problems listed above:  1. Primary prevention stressed with the patient.  Importance of compliance with diet and medications stressed and she vocalized understanding.  Her blood pressure is stable.  Her examination today was unremarkable.  Her EKG was fine today.  She is undergoing tonsillectomy in the next few days in view of recurrent strep throat.Patient will be seen in follow-up appointment in 9 months or earlier if the patient has any concerns    Medication Adjustments/Labs and Tests Ordered: Current medicines are reviewed at length with the patient today.  Concerns regarding medicines are outlined above.  No orders of the defined types were placed in this encounter.  No orders of the defined types were placed in this encounter.    No chief complaint on file.    History of Present Illness:    Janice Raymond is a 32 y.o. female.  The patient was evaluated by me for pericarditis.  When I saw the last time her symptoms that pretty much resolved.  She denies any problems at this time and takes care of activities of daily living.  No chest pain orthopnea or PND.  She is very active.  She is asymptomatic at this time.  She requests an EKG as a baseline as most of her EKGs were done when she had symptoms suggesting pericarditis.  Past Medical History:  Diagnosis Date  . Acute pericarditis, unspecified 12/01/2017  . Fatigue 11/05/2014  . Ganglion cyst of wrist, left 12/21/2016  . GERD (gastroesophageal reflux disease) 11/05/2014  . LLQ pain 01/16/2013  . Obesity, unspecified 12/28/2013  . Postpartum care following vaginal delivery (8/26) 06/21/2016  . Routine gynecological examination 09/19/2012  . Second-degree  perineal laceration, with delivery 06/21/2016    Past Surgical History:  Procedure Laterality Date  . NO PAST SURGERIES      Current Medications: Current Meds  Medication Sig  . fluconazole (DIFLUCAN) 150 MG tablet 1 tab by mouth now for yeast infection. May repeat in 3 days if symptoms are not improve.  . meloxicam (MOBIC) 7.5 MG tablet Take 1 tablet (7.5 mg total) by mouth daily.  Marland Kitchen omeprazole (PRILOSEC) 40 MG capsule Take 1 capsule (40 mg total) by mouth daily.  . [DISCONTINUED] amoxicillin (AMOXIL) 500 MG capsule Take 1 capsule (500 mg total) by mouth 3 (three) times daily.     Allergies:   Patient has no known allergies.   Social History   Socioeconomic History  . Marital status: Married    Spouse name: Not on file  . Number of children: Not on file  . Years of education: Not on file  . Highest education level: Not on file  Occupational History  . Not on file  Social Needs  . Financial resource strain: Not on file  . Food insecurity:    Worry: Not on file    Inability: Not on file  . Transportation needs:    Medical: Not on file    Non-medical: Not on file  Tobacco Use  . Smoking status: Never Smoker  . Smokeless tobacco: Never Used  Substance and Sexual Activity  . Alcohol use: Yes    Alcohol/week: 4.2 oz    Types:  7 Glasses of wine per week  . Drug use: No  . Sexual activity: Yes    Birth control/protection: Other-see comments    Comment: Husband had vasectomy  Lifestyle  . Physical activity:    Days per week: Not on file    Minutes per session: Not on file  . Stress: Not on file  Relationships  . Social connections:    Talks on phone: Not on file    Gets together: Not on file    Attends religious service: Not on file    Active member of club or organization: Not on file    Attends meetings of clubs or organizations: Not on file    Relationship status: Not on file  Other Topics Concern  . Not on file  Social History Narrative   One son born 2017    Has worked at [employer]   Enjoys sleeping, thrift shopping   Married     Family History: The patient's family history includes Breast cancer in her maternal aunt, maternal aunt, and maternal aunt; Depression in her mother; Diabetes in her mother; Prostate cancer in her paternal uncle and paternal uncle; Schizophrenia in her mother; Vitamin D deficiency in her father.  ROS:   Please see the history of present illness.    All other systems reviewed and are negative.  EKGs/Labs/Other Studies Reviewed:    The following studies were reviewed today: EKG reveals sinus rhythm and nonspecific ST-T changes   Recent Labs: 11/26/2017: ALT 22; BUN 10; Creatinine, Ser 0.57; Potassium 3.7; Sodium 136 03/01/2018: Hemoglobin 12.4; Platelets 279.0  Recent Lipid Panel    Component Value Date/Time   CHOL 184 02/08/2017 1619   TRIG 155.0 (H) 02/08/2017 1619   HDL 72.40 02/08/2017 1619   CHOLHDL 3 02/08/2017 1619   VLDL 31.0 02/08/2017 1619   LDLCALC 80 02/08/2017 1619   LDLDIRECT 95.3 09/19/2012 1116    Physical Exam:    VS:  BP 124/72 (BP Location: Right Arm, Patient Position: Sitting, Cuff Size: Normal)   Pulse 95   Ht  (1.549 m)   Wt 182 lb 1.9 oz (82.6 kg)   LMP 02/26/2018   SpO2 98%   BMI 34.41 kg/m     Wt Readings from Last 3 Encounters:  03/17/18 182 lb 1.9 oz (82.6 kg)  03/04/18 183 lb 6.4 oz (83.2 kg)  03/01/18 189 lb 6.4 oz (85.9 kg)     GEN: Patient is in no acute distress HEENT: Normal NECK: No JVD; No carotid bruits LYMPHATICS: No lymphadenopathy CARDIAC: Hear sounds regular, 2/6 systolic murmur at the apex. RESPIRATORY:  Clear to auscultation without rales, wheezing or rhonchi  ABDOMEN: Soft, non-tender, non-distended MUSCULOSKELETAL:  No edema; No deformity  SKIN: Warm and dry NEUROLOGIC:  Alert and oriented x 3 PSYCHIATRIC:  Normal affect   Signed, Garwin Brothers, MD  03/17/2018 9:25 AM    [facility]

## 2018-03-17 NOTE — Patient Instructions (Signed)
Medication Instructions:  Your physician recommends that you continue on your current medications as directed. Please refer to the Current Medication list given to you today.    Labwork: None  Testing/Procedures: None  Follow-Up: Your physician recommends that you schedule a follow-up appointment in: 9 months   Any Other Special Instructions Will Be Listed Below (If Applicable).     If you need a refill on your cardiac medications before your next appointment, please call your pharmacy.   

## 2018-03-28 ENCOUNTER — Other Ambulatory Visit: Payer: Self-pay | Admitting: Otolaryngology

## 2018-05-05 ENCOUNTER — Other Ambulatory Visit: Payer: Self-pay | Admitting: Family

## 2018-05-12 ENCOUNTER — Encounter: Payer: Self-pay | Admitting: Family

## 2018-05-12 ENCOUNTER — Encounter: Payer: Self-pay | Admitting: Cardiology

## 2018-05-12 NOTE — Telephone Encounter (Signed)
Dr. Josiah Loboevenkar- could you please see mychart message on our mutual patient and let me know if you have additional thoughts/concerns about her question. Thanks!

## 2018-05-28 ENCOUNTER — Other Ambulatory Visit: Payer: Self-pay | Admitting: Family

## 2018-07-25 ENCOUNTER — Other Ambulatory Visit: Payer: Self-pay | Admitting: Orthopedic Surgery

## 2018-07-25 DIAGNOSIS — R229 Localized swelling, mass and lump, unspecified: Principal | ICD-10-CM

## 2018-07-25 DIAGNOSIS — IMO0002 Reserved for concepts with insufficient information to code with codable children: Secondary | ICD-10-CM

## 2018-08-09 ENCOUNTER — Inpatient Hospital Stay: Admission: RE | Admit: 2018-08-09 | Payer: BLUE CROSS/BLUE SHIELD | Source: Ambulatory Visit

## 2018-08-09 ENCOUNTER — Other Ambulatory Visit: Payer: BLUE CROSS/BLUE SHIELD

## 2018-08-18 ENCOUNTER — Ambulatory Visit
Admission: RE | Admit: 2018-08-18 | Discharge: 2018-08-18 | Disposition: A | Discharge status: Home / Self Care | Payer: BLUE CROSS/BLUE SHIELD | Source: Ambulatory Visit | Attending: Orthopedic Surgery | Admitting: Orthopedic Surgery

## 2018-08-18 DIAGNOSIS — R229 Localized swelling, mass and lump, unspecified: Principal | ICD-10-CM

## 2018-08-18 DIAGNOSIS — IMO0002 Reserved for concepts with insufficient information to code with codable children: Secondary | ICD-10-CM

## 2018-08-18 MED ORDER — IOPAMIDOL (ISOVUE-M 200) INJECTION 41%
2.0000 mL | Freq: Once | INTRAMUSCULAR | Status: AC
  Administered 2018-08-18: 2 mL via INTRA_ARTICULAR

## 2018-09-16 ENCOUNTER — Other Ambulatory Visit: Payer: Self-pay | Admitting: Family

## 2018-09-16 MED ORDER — OMEPRAZOLE 40 MG PO CPDR: 40.0000 mg | DELAYED_RELEASE_CAPSULE | Freq: Every day | ORAL | 3 refills | Status: DC

## 2018-11-24 ENCOUNTER — Ambulatory Visit: Payer: Self-pay

## 2018-11-24 NOTE — Telephone Encounter (Signed)
Patient called in and says her 34 year old son was diagnosed with the flu today when she took him to the pediatrician. She says she has no symptoms and would like to be prescribed Tamiflu to prevent getting the flu. She says she works a 12 hour job and can't afford to be out sick. I advised I will send to [facility] and someone will call with her recommendation.   Reason for Disposition . [1] Influenza EXPOSURE within last 48 hours (2 days) AND [2] NOT HIGH RISK AND [3] strongly requests antiviral medication  Answer Assessment - Initial Assessment Questions 1. TYPE of EXPOSURE: "How were you exposed?" (e.g., close contact, not a close contact)     Close contact with 70 year old son 2. DATE of EXPOSURE: "When did the exposure occur?" (e.g., hour, days, weeks)     Diagnosed today 3. PREGNANCY: "Is there any chance you are pregnant?" "When was your last menstrual period?"     No; LMP last month 4. HIGH RISK for COMPLICATIONS: "Do you have any heart or lung problems? Do you have a weakened immune system?" (e.g., CHF, COPD, asthma, HIV positive, chemotherapy, renal failure, diabetes mellitus, sickle cell anemia)     No 5. SYMPTOMS: "Do you have any symptoms?" (e.g., cough, fever, sore throat, difficulty breathing).     No  Protocols used: INFLUENZA EXPOSURE-A-AH

## 2018-11-25 MED ORDER — OSELTAMIVIR PHOSPHATE 75 MG PO CAPS: 75.0000 mg | ORAL_CAPSULE | Freq: Every day | ORAL | 0 refills | Status: DC

## 2018-11-25 NOTE — Telephone Encounter (Signed)
rx has been sent for tamiflu.

## 2018-11-25 NOTE — Addendum Note (Signed)
Addended by: Sandford Craze on: 11/25/2018 07:15 AM   Modules accepted: Orders

## 2018-12-21 ENCOUNTER — Ambulatory Visit: Payer: BLUE CROSS/BLUE SHIELD | Admitting: Family

## 2018-12-30 ENCOUNTER — Other Ambulatory Visit: Payer: Self-pay

## 2018-12-30 MED ORDER — OMEPRAZOLE 40 MG PO CPDR: 40.0000 mg | DELAYED_RELEASE_CAPSULE | Freq: Every day | ORAL | 2 refills | Status: DC

## 2019-01-13 ENCOUNTER — Encounter: Payer: Self-pay | Admitting: Family

## 2019-01-18 MED ORDER — OMEPRAZOLE 20 MG PO CPDR: 20.0000 mg | DELAYED_RELEASE_CAPSULE | Freq: Every day | ORAL | 3 refills | Status: DC

## 2019-01-18 NOTE — Telephone Encounter (Signed)
Pt called and stated once she had her tonsils taken out, she feels like she is doing better, and does not feel she needs to be on omeprazole 40mg . Pt is requesting to go back to 20mg  and for this to be sent into the pharmacy.

## 2019-01-31 ENCOUNTER — Telehealth: Payer: Self-pay

## 2019-01-31 NOTE — Telephone Encounter (Signed)
Pt called to schedule new patient appointment. Office closed. Please reach back out to patient.

## 2019-01-31 NOTE — Telephone Encounter (Signed)
LM asking pt to call back to schedule a TOC appt with Dr. Beverely Low

## 2019-01-31 NOTE — Telephone Encounter (Signed)
Copied from CRM [id number]. Topic: Appointment Scheduling - Transfer of Care >> Jan 30, 2019  3:14 PM Trula Slade wrote:   Pt is requesting to transfer FROM: Sandford Craze Pt is requesting to transfer TO:  Dr. Neena Rhymes Reason for requested transfer:   Dr. Beverely Low left [facility] and she thought the drive would be to long, so she changed to a new provider.  Since then, she discovered the drive was not that far away and she would like to become Dr. Rennis Golden patient again. Can return call to her work number before 5pm.  After 5pm, please call her cellphone number.  Send CRM to patient's current PCP (transferring FROM). >> Jan 30, 2019  4:17 PM Sheliah Hatch, MD wrote: Rip Harbour to re-establish

## 2019-02-01 NOTE — Telephone Encounter (Signed)
LMOVM for patient to call back and schedule TOC appt.

## 2019-02-01 NOTE — Telephone Encounter (Signed)
TOC appt has been scheduled 

## 2019-02-02 ENCOUNTER — Ambulatory Visit (INDEPENDENT_AMBULATORY_CARE_PROVIDER_SITE_OTHER): Payer: BLUE CROSS/BLUE SHIELD | Admitting: Family Medicine

## 2019-02-02 ENCOUNTER — Encounter: Payer: Self-pay | Admitting: Family Medicine

## 2019-02-02 ENCOUNTER — Other Ambulatory Visit: Payer: Self-pay

## 2019-02-02 DIAGNOSIS — R1012 Left upper quadrant pain: Secondary | ICD-10-CM | POA: Diagnosis not present

## 2019-02-02 NOTE — Progress Notes (Signed)
I have discussed the procedure for the virtual visit with the patient who has given consent to proceed with assessment and treatment.   Kadarius Cuffe S Mahlet Jergens, CMA     

## 2019-02-02 NOTE — Progress Notes (Signed)
   Virtual Visit via Video   I connected with Janice Raymond on 02/02/19 at 10:30 AM EDT by a video enabled telemedicine application and verified that I am speaking with the correct person using two identifiers. Location patient: Home Location provider: [facility], Office Persons participating in the virtual visit: pt and myself  I discussed the limitations of evaluation and management by telemedicine and the availability of in person appointments. The patient expressed understanding and agreed to proceed.  Subjective:   HPI:  abd pain- having pain in mid axillary line from ribs down.  Pain is intermittent.  Pain described as a 'throbbing cramp'- 'like if my period was in my side'.  sxs started ~2 weeks ago.  No N/V.  No changes to bowel habits.  Some increased gas/bloating.  Continues to have regular BMs.  Pain is not related to eating.  No change in pain after BMs.  No blood in urine or change to urinary habits.  Did start exercising.  Stools have been 'a little hard'.  ROS: See pertinent positives and negatives per HPI.  Patient Active Problem List   Diagnosis Date Noted  . History of pericarditis 03/17/2018  . Ganglion cyst of wrist, left 12/21/2016  . GERD (gastroesophageal reflux disease) 11/05/2014  . Obesity, unspecified 12/28/2013  . LLQ pain 01/16/2013  . Routine gynecological examination 09/19/2012  . HSV 06/27/2010    Social History   Tobacco Use  . Smoking status: Never Smoker  . Smokeless tobacco: Never Used  Substance Use Topics  . Alcohol use: Yes    Alcohol/week: 7.0 standard drinks    Types: 7 Glasses of wine per week    Current Outpatient Medications:  .  omeprazole (PRILOSEC) 20 MG capsule, Take 1 capsule (20 mg total) by mouth daily., Disp: 30 capsule, Rfl: 3  No Known Allergies  Objective:   There were no vitals taken for this visit. AAOx3, NAD NCAT, EOMI No obvious CN deficits Coloring WNL Pt is able to speak clearly, coherently  without shortness of breath or increased work of breathing.  Thought process is linear.  Mood is appropriate.   Assessment and Plan:   LUQ abdominal pain- pain is in mid-axillary line so not true abdominal pain and not true flank pain.  Pt reports hx of constipation.  No change in discomfort w/ food or BM.  Pt reports discomfort started after she began working out.  Most likely cause of her sxs are either constipation as the colon makes a right angle in the LUQ or musculoskeletal.  Encouraged stool softeners, increased fluids and fiber.  Pt to monitor sxs and notify me if any changes.  Pt expressed understanding and is in agreement w/ plan.   Neena Rhymes, MD 02/02/2019

## 2019-02-09 ENCOUNTER — Emergency Department (HOSPITAL_BASED_OUTPATIENT_CLINIC_OR_DEPARTMENT_OTHER)
Admission: EM | Admit: 2019-02-09 | Discharge: 2019-02-09 | Disposition: A | Discharge status: Home / Self Care | Payer: BLUE CROSS/BLUE SHIELD | Attending: Emergency Medicine | Admitting: Emergency Medicine

## 2019-02-09 ENCOUNTER — Telehealth: Payer: Self-pay

## 2019-02-09 ENCOUNTER — Emergency Department (HOSPITAL_BASED_OUTPATIENT_CLINIC_OR_DEPARTMENT_OTHER): Payer: BLUE CROSS/BLUE SHIELD

## 2019-02-09 ENCOUNTER — Encounter (HOSPITAL_BASED_OUTPATIENT_CLINIC_OR_DEPARTMENT_OTHER): Payer: Self-pay

## 2019-02-09 ENCOUNTER — Other Ambulatory Visit: Payer: Self-pay

## 2019-02-09 DIAGNOSIS — R079 Chest pain, unspecified: Secondary | ICD-10-CM | POA: Diagnosis present

## 2019-02-09 DIAGNOSIS — Z79899 Other long term (current) drug therapy: Secondary | ICD-10-CM | POA: Diagnosis not present

## 2019-02-09 LAB — CBC WITH DIFFERENTIAL/PLATELET
Abs Immature Granulocytes: 0.02 10*3/uL (ref 0.00–0.07)
Basophils Absolute: 0 10*3/uL (ref 0.0–0.1)
Basophils Relative: 0 %
Eosinophils Absolute: 0.1 10*3/uL (ref 0.0–0.5)
Eosinophils Relative: 1 %
HCT: 40 % (ref 36.0–46.0)
Hemoglobin: 12.4 g/dL (ref 12.0–15.0)
Immature Granulocytes: 0 %
Lymphocytes Relative: 42 %
Lymphs Abs: 3.9 10*3/uL (ref 0.7–4.0)
MCH: 27.2 pg (ref 26.0–34.0)
MCHC: 31 g/dL (ref 30.0–36.0)
MCV: 87.7 fL (ref 80.0–100.0)
Monocytes Absolute: 0.9 10*3/uL (ref 0.1–1.0)
Monocytes Relative: 10 %
Neutro Abs: 4.3 10*3/uL (ref 1.7–7.7)
Neutrophils Relative %: 47 %
Platelets: 292 10*3/uL (ref 150–400)
RBC: 4.56 MIL/uL (ref 3.87–5.11)
RDW: 12.1 % (ref 11.5–15.5)
WBC: 9.3 10*3/uL (ref 4.0–10.5)
nRBC: 0 % (ref 0.0–0.2)

## 2019-02-09 LAB — COMPREHENSIVE METABOLIC PANEL
ALT: 17 U/L (ref 0–44)
AST: 19 U/L (ref 15–41)
Albumin: 4.4 g/dL (ref 3.5–5.0)
Alkaline Phosphatase: 77 U/L (ref 38–126)
Anion gap: 8 (ref 5–15)
BUN: 12 mg/dL (ref 6–20)
CO2: 25 mmol/L (ref 22–32)
Calcium: 9.4 mg/dL (ref 8.9–10.3)
Chloride: 107 mmol/L (ref 98–111)
Creatinine, Ser: 0.69 mg/dL (ref 0.44–1.00)
GFR calc Af Amer: >60 mL/min (ref >60)
GFR calc non Af Amer: >60 mL/min (ref >60)
Glucose, Bld: 88 mg/dL (ref 70–99)
Potassium: 4.1 mmol/L (ref 3.5–5.1)
Sodium: 140 mmol/L (ref 135–145)
Total Bilirubin: 0.7 mg/dL (ref 0.3–1.2)
Total Protein: 8.2 g/dL — ABNORMAL HIGH (ref 6.5–8.1)

## 2019-02-09 LAB — TROPONIN I: Troponin I: <0.03 ng/mL (ref <0.03)

## 2019-02-09 LAB — PREGNANCY, URINE: Preg Test, Ur: NEGATIVE

## 2019-02-09 MED ORDER — KETOROLAC TROMETHAMINE 30 MG/ML IJ SOLN
30.0000 mg | Freq: Once | INTRAMUSCULAR | Status: AC
  Administered 2019-02-09: 18:00:00 30 mg via INTRAVENOUS
  Filled 2019-02-09: qty 1

## 2019-02-09 NOTE — ED Provider Notes (Signed)
[facility] EMERGENCY DEPARTMENT Provider Note   CSN: [id number] Arrival date & time: 02/09/19  1740    History   Chief Complaint Chief Complaint  Patient presents with  . Chest Pain    HPI Janice Raymond is a 33 y.o. female.     Patient is a 33 year old female with past medical history of pericarditis.  She presents today with complaints of chest discomfort.  This started yesterday morning.  She reports a sharp pain to the center of her chest that is worse with breathing.  She denies any fevers, chills, or productive cough.  She denies any shortness of breath, nausea, diaphoresis, or radiation to the arm or jaw.  She has had no exertional symptoms.  Patient has no cardiac risk factors and no prior cardiac history with the exception of being told she had pericarditis last year.  This feels different.  The history is provided by the patient.  Chest Pain    Past Medical History:  Diagnosis Date  . Acute pericarditis, unspecified 12/01/2017  . Fatigue 11/05/2014  . Ganglion cyst of wrist, left 12/21/2016  . GERD (gastroesophageal reflux disease) 11/05/2014  . LLQ pain 01/16/2013  . Obesity, unspecified 12/28/2013  . Postpartum care following vaginal delivery (8/26) 06/21/2016  . Routine gynecological examination 09/19/2012  . Second-degree perineal laceration, with delivery 06/21/2016    Patient Active Problem List   Diagnosis Date Noted  . History of pericarditis 03/17/2018  . Ganglion cyst of wrist, left 12/21/2016  . GERD (gastroesophageal reflux disease) 11/05/2014  . Obesity, unspecified 12/28/2013  . Routine gynecological examination 09/19/2012  . 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 NO PAST SURGERIES    . TONSILLECTOMY       OB History    Gravida  1   Para  1   Term  1   Preterm      AB      Living  1     SAB      TAB      Ectopic      Multiple  0   Live Births  1            Home Medications     Prior to Admission medications   Medication Sig Start Date End Date Taking? Authorizing Provider  omeprazole (PRILOSEC) 20 MG capsule Take 1 capsule (20 mg total) by mouth daily. 01/18/19   Sandford Craze'Sullivan, Melissa, NP    Family History Family History  Problem Relation Age of Onset  . Diabetes Mother   . Depression Mother   . Schizophrenia Mother   . Vitamin D deficiency Father   . Breast cancer Maternal Aunt   . Prostate cancer Paternal Uncle   . Breast cancer Maternal Aunt   . Breast cancer Maternal Aunt   . Prostate cancer Paternal Uncle     Social History Social History   Tobacco Use  . Smoking status: Never Smoker  . Smokeless tobacco: Never Used  Substance Use Topics  . Alcohol use: Yes    Alcohol/week: 7.0 standard drinks    Types: 7 Glasses of wine per week  . Drug use: No     Allergies   Patient has no known allergies.   Review of Systems Review of Systems  Cardiovascular: Positive for chest pain.  All other systems reviewed and are negative.    Physical Exam Updated Vital Signs BP (!) 123/94 (BP Location: Right Arm)   Pulse 70  Temp 98.4 F (36.9 C)   Resp 20   Ht 5\' 5"  (1.651 m)   Wt 79.4 kg   LMP 02/02/2019   SpO2 100%   BMI 29.12 kg/m   Physical Exam Vitals signs and nursing note reviewed.  Constitutional:      General: She is not in acute distress.    Appearance: She is well-developed. She is not diaphoretic.  HENT:     Head: Normocephalic and atraumatic.  Neck:     Musculoskeletal: Normal range of motion and neck supple.  Cardiovascular:     Rate and Rhythm: Normal rate and regular rhythm.     Heart sounds: No murmur. No friction rub. No gallop.   Pulmonary:     Effort: Pulmonary effort is normal. No respiratory distress.     Breath sounds: Normal breath sounds. No wheezing.  Abdominal:     General: Bowel sounds are normal. There is no distension.     Palpations: Abdomen is soft.     Tenderness: There is no abdominal tenderness.   Musculoskeletal: Normal range of motion.     Right lower leg: She exhibits no tenderness. No edema.     Left lower leg: She exhibits no tenderness. No edema.     Comments: Homans sign is absent bilaterally.  Skin:    General: Skin is warm and dry.  Neurological:     Mental Status: She is alert and oriented to person, place, and time.      ED Treatments / Results  Labs (all labs ordered are listed, but only abnormal results are displayed) Labs Reviewed - No data to display  EKG EKG Interpretation  Date/Time:  Thursday February 09 2019 17:52:10 EDT Ventricular Rate:  80 PR Interval:    QRS Duration: 84 QT Interval:  369 QTC Calculation: 426 R Axis:   75 Text Interpretation:  Sinus rhythm ST elev, probable normal early repol pattern Confirmed by Geoffery Lyons ([id number]) on 02/09/2019 5:54:42 PM   Radiology No results found.  Procedures Procedures (including critical care time)  Medications Ordered in ED Medications - No data to display   Initial Impression / Assessment and Plan / ED Course  I have reviewed the triage vital signs and the nursing notes.  Pertinent labs & imaging results that were available during my care of the patient were reviewed by me and considered in my medical decision making (see chart for details).  Patient presents here with complaints of chest discomfort.  She describes pressure to the center of her chest that is worse with inspiration.  She denies shortness of breath, fever, or cough.  Her work-up shows an unchanged, essentially normal EKG with negative troponin with greater than 24 hours of symptoms.  Chest x-ray is clear.  I highly doubt a cardiac etiology and feel as though treatment with NSAIDs and outpatient follow-up is appropriate.  I have also considered pulmonary embolism, however oxygen saturations are 100%, heart rate is 70, and there is no tachypnea.  I highly doubt this possibility.  Final Clinical Impressions(s) / ED Diagnoses    Final diagnoses:  None    ED Discharge Orders    None       Geoffery Lyons, MD 02/09/19 2103

## 2019-02-09 NOTE — ED Triage Notes (Signed)
Pt c/o chest pain x1 day. Pt reports it is worse with deep inspiration. Pt denies cough, fever, or other associated symptoms. Pt denies any alleviating or aggravating factors.

## 2019-02-09 NOTE — Discharge Instructions (Addendum)
Ibuprofen 600 mg every 6 hours as needed for pain.  Follow-up with your primary doctor if not improving in the next week, and return to the ER if you develop worsening pain, high fever, worsening breathing, or other new and concerning symptoms.

## 2019-02-09 NOTE — Telephone Encounter (Signed)
Called from work to report 5/10 mid chest tightness that has come and gone since yesterday. She deniies shortness of breath or irregular heart rate. She has hx of pericarditis but states this feels different. She also has hx of GERD but this feels different that that too. She took a prilosec and 2 Pepcid AC's which didn't help much. She checked her VS 137/87, 77, 100% O2 sat. Instructed to go to Urgent Care or ER for further evaluation.

## 2019-02-10 ENCOUNTER — Telehealth: Payer: Self-pay | Admitting: *Deleted

## 2019-02-10 ENCOUNTER — Telehealth (INDEPENDENT_AMBULATORY_CARE_PROVIDER_SITE_OTHER): Payer: BLUE CROSS/BLUE SHIELD | Admitting: Cardiology

## 2019-02-10 DIAGNOSIS — R0789 Other chest pain: Secondary | ICD-10-CM | POA: Insufficient documentation

## 2019-02-10 DIAGNOSIS — Z8679 Personal history of other diseases of the circulatory system: Secondary | ICD-10-CM

## 2019-02-10 HISTORY — DX: Other chest pain: R07.89

## 2019-02-10 NOTE — Progress Notes (Signed)
Virtual Visit via Video Note   This visit type was conducted due to national recommendations for restrictions regarding the COVID-19 Pandemic (e.g. social distancing) in an effort to limit this patient's exposure and mitigate transmission in our community.  Due to her co-morbid illnesses, this patient is at least at moderate risk for complications without adequate follow up.  This format is felt to be most appropriate for this patient at this time.  All issues noted in this document were discussed and addressed.  A limited physical exam was performed with this format.  Please refer to the patient's chart for her consent to telehealth for [facility].   Evaluation Performed:  Follow-up visit  Date:  02/10/2019   ID:  Janice Colasdrian Rolin, DOB 1985-11-02, MRN [id number]  Patient Location: Other:  work Provider Location: Other:  work  PCP:  Sheliah Hatchabori, Katherine E, MD  Cardiologist:  No primary care provider on file.  Electrophysiologist:  None   Chief Complaint: Chest discomfort  History of Present Illness:    Janice Raymond is a 33 y.o. female with no significant past medical history.  She has had history of pericarditis in the past.  She started having chest pain and went to the emergency room.  She was evaluated treated and released and NSAIDs were prescribed.  She mentions to me that she is doing fine.  She is seen today by video conferencing and appears comfortable.  She is at work.  She mentions to me that she walks about 2 miles a day 3-4 times a week without any problems.  She did not have any issues with deep breathing or coughing and that did not reproduce the pain today.  She said she had a mild issue with that yesterday.  The patient does not have symptoms concerning for COVID-19 infection (fever, chills, cough, or new shortness of breath).    Past Medical History:  Diagnosis Date  . Acute pericarditis, unspecified 12/01/2017  . Fatigue 11/05/2014  . Ganglion cyst of wrist, left  12/21/2016  . GERD (gastroesophageal reflux disease) 11/05/2014  . LLQ pain 01/16/2013  . Obesity, unspecified 12/28/2013  . Postpartum care following vaginal delivery (8/26) 06/21/2016  . Routine gynecological examination 09/19/2012  . Second-degree perineal laceration, with delivery 06/21/2016   Past Surgical History:  Procedure Laterality Date  . NO PAST SURGERIES    . TONSILLECTOMY       No outpatient medications have been marked as taking for the 02/10/19 encounter (Appointment) with , Aundra Dubinajan R, MD.     Allergies:   Patient has no known allergies.   Social History   Tobacco Use  . Smoking status: Never Smoker  . Smokeless tobacco: Never Used  Substance Use Topics  . Alcohol use: Yes    Alcohol/week: 7.0 standard drinks    Types: 7 Glasses of wine per week  . Drug use: No     Family Hx: The patient's family history includes Breast cancer in her maternal aunt, maternal aunt, and maternal aunt; Depression in her mother; Diabetes in her mother; Prostate cancer in her paternal uncle and paternal uncle; Schizophrenia in her mother; Vitamin D deficiency in her father.  ROS:   Please see the history of present illness.    Again today patient denies any history of chest pain orthopnea.  She mentions to me that when she woke up this morning uneasiness in the anterior part of her chest predominantly on the chest wall.  Completely resolved with NSAIDs. All other systems reviewed  and are negative.   Prior CV studies:   The following studies were reviewed today:  I reviewed emergency room visit from yesterday in detail  Labs/Other Tests and Data Reviewed:    EKG:  An ECG dated April 16th was personally reviewed today and demonstrated:  .  Recent Labs: 02/09/2019: ALT 17; BUN 12; Creatinine, Ser 0.69; Hemoglobin 12.4; Platelets 292; Potassium 4.1; Sodium 140   Recent Lipid Panel Lab Results  Component Value Date/Time   CHOL 184 02/08/2017 04:19 PM   TRIG 155.0 (H)  02/08/2017 04:19 PM   HDL 72.40 02/08/2017 04:19 PM   CHOLHDL 3 02/08/2017 04:19 PM   LDLCALC 80 02/08/2017 04:19 PM   LDLDIRECT 95.3 09/19/2012 11:16 AM    Wt Readings from Last 3 Encounters:  02/09/19 175 lb (79.4 kg)  03/17/18 182 lb 1.9 oz (82.6 kg)  03/04/18 183 lb 6.4 oz (83.2 kg)     Objective:    Vital Signs:  LMP 02/02/2019    VITAL SIGNS:  reviewed  ASSESSMENT & PLAN:    1. Chest discomfort: I reassured the patient about my findings.  I clinically agree with the emergency room physician.  This does not clinically look like pericarditis.  EKG also has been unremarkable.  It may be chest discomfort from the chest wall probably musculoskeletal.  It has completely resolved with taking NSAIDs.  She walks 2 miles a day without any problems.  Today when she took a deep breath or when she coughed when I was evaluating her she had no symptoms and she is happy about it. 2. She will be seen in follow-up appointment in a week or earlier if she has any concerns.  She knows to go to the nearest emergency room for any concerning symptoms.  She will take NSAIDs as prescribed by emergency room doctor and also will take acid reflux medications.  COVID-19 Education: The signs and symptoms of COVID-19 were discussed with the patient and how to seek care for testing (follow up with PCP or arrange E-visit).  The importance of social distancing was discussed today.  Time:   Today, I have spent 17 minutes with the patient with telehealth technology discussing the above problems.     Medication Adjustments/Labs and Tests Ordered: Current medicines are reviewed at length with the patient today.  Concerns regarding medicines are outlined above.   Tests Ordered: No orders of the defined types were placed in this encounter.   Medication Changes: No orders of the defined types were placed in this encounter.   Disposition:  Follow up in 1 week(s)  Signed, Garwin Brothers, MD  02/10/2019  10:09 AM    [facility]

## 2019-02-10 NOTE — Telephone Encounter (Signed)
Patient went to [facility] after speaking with Amil Amen yesterday. She is currently feeling better and was able to go to work this morning. RN left message with spouse Dorinda Hill to have patient return call for scheduling of televisit.

## 2019-02-10 NOTE — Telephone Encounter (Signed)
Pl get her a televisit with me this MORNING

## 2019-02-10 NOTE — Telephone Encounter (Signed)
Left message for pt to make an appointment with Dr. Janey Genta for next week.

## 2019-02-17 ENCOUNTER — Encounter: Payer: Self-pay | Admitting: Cardiology

## 2019-02-17 ENCOUNTER — Other Ambulatory Visit: Payer: Self-pay

## 2019-02-17 ENCOUNTER — Telehealth (INDEPENDENT_AMBULATORY_CARE_PROVIDER_SITE_OTHER): Payer: BLUE CROSS/BLUE SHIELD | Admitting: Cardiology

## 2019-02-17 VITALS — Ht 65.0 in | Wt 175.0 lb

## 2019-02-17 DIAGNOSIS — R0789 Other chest pain: Secondary | ICD-10-CM | POA: Diagnosis not present

## 2019-02-17 NOTE — Patient Instructions (Signed)

## 2019-02-17 NOTE — Progress Notes (Signed)
Virtual Visit via Video Note   This visit type was conducted due to national recommendations for restrictions regarding the COVID-19 Pandemic (e.g. social distancing) in an effort to limit this patient's exposure and mitigate transmission in our community.  Due to her co-morbid illnesses, this patient is at least at moderate risk for complications without adequate follow up.  This format is felt to be most appropriate for this patient at this time.  All issues noted in this document were discussed and addressed.  A limited physical exam was performed with this format.  Please refer to the patient's chart for her consent to telehealth for [facility].   Evaluation Performed:  Follow-up visit  Date:  02/17/2019   ID:  Janice Raymond, DOB 29-Nov-1985, MRN [id number]  Patient Location: Home Provider Location: Home  PCP:  Sheliah Hatchabori, Katherine E, MD  Cardiologist:  No primary care provider on file.  Electrophysiologist:  None   Chief Complaint: Chest discomfort  History of Present Illness:    Janice Colasdrian Halvorson is a 33 y.o. female with past medical history of pericarditis.  She went to the emergency room with chest pain and was evaluated and discharged.  Subsequently she is done fine.  No chest pain orthopnea or PND.  It is been 3 days that she has used any NSAID.  She is walking on a regular basis.  She is happy about it.  At the time of my evaluation, the patient is alert awake oriented and in no distress.  The patient does not have symptoms concerning for COVID-19 infection (fever, chills, cough, or new shortness of breath).    Past Medical History:  Diagnosis Date  . Acute pericarditis, unspecified 12/01/2017  . Fatigue 11/05/2014  . Ganglion cyst of wrist, left 12/21/2016  . GERD (gastroesophageal reflux disease) 11/05/2014  . LLQ pain 01/16/2013  . Obesity, unspecified 12/28/2013  . Postpartum care following vaginal delivery (8/26) 06/21/2016  . Routine gynecological examination 09/19/2012   . Second-degree perineal laceration, with delivery 06/21/2016   Past Surgical History:  Procedure Laterality Date  . NO PAST SURGERIES    . TONSILLECTOMY       Current Meds  Medication Sig  . ibuprofen (ADVIL) 600 MG tablet Take 600 mg by mouth every 6 (six) hours as needed.  Marland Kitchen. omeprazole (PRILOSEC) 20 MG capsule Take 1 capsule (20 mg total) by mouth daily.     Allergies:   Patient has no known allergies.   Social History   Tobacco Use  . Smoking status: Never Smoker  . Smokeless tobacco: Never Used  Substance Use Topics  . Alcohol use: Yes    Alcohol/week: 7.0 standard drinks    Types: 7 Glasses of wine per week  . Drug use: No     Family Hx: The patient's family history includes Breast cancer in her maternal aunt, maternal aunt, and maternal aunt; Depression in her mother; Diabetes in her mother; Prostate cancer in her paternal uncle and paternal uncle; Schizophrenia in her mother; Vitamin D deficiency in her father.  ROS:   Please see the history of present illness.    No chest pain All other systems reviewed and are negative.   Prior CV studies:   The following studies were reviewed today:  None  Labs/Other Tests and Data Reviewed:    EKG:  No ECG reviewed.  Recent Labs: 02/09/2019: ALT 17; BUN 12; Creatinine, Ser 0.69; Hemoglobin 12.4; Platelets 292; Potassium 4.1; Sodium 140   Recent Lipid Panel Lab Results  Component Value Date/Time   CHOL 184 02/08/2017 04:19 PM   TRIG 155.0 (H) 02/08/2017 04:19 PM   HDL 72.40 02/08/2017 04:19 PM   CHOLHDL 3 02/08/2017 04:19 PM   LDLCALC 80 02/08/2017 04:19 PM   LDLDIRECT 95.3 09/19/2012 11:16 AM    Wt Readings from Last 3 Encounters:  02/17/19 175 lb (79.4 kg)  02/09/19 175 lb (79.4 kg)  03/17/18 182 lb 1.9 oz (82.6 kg)     Objective:    Vital Signs:  Ht 5\' 5"  (1.651 m)   Wt 175 lb (79.4 kg)   LMP 02/02/2019   BMI 29.12 kg/m    VITAL SIGNS:  reviewed.  No blood pressure was provided by the  patient.  ASSESSMENT & PLAN:    1. Chest discomfort: Likely mentioned before it does not appear life.  Patient is very comfortable now she is not using NSAIDs.  I reassured her about findings.  On exercise she has no symptoms.  She is doing well and will be seen in follow-up appointment in 6 months or earlier if she has any concerns.  COVID-19 Education: The signs and symptoms of COVID-19 were discussed with the patient and how to seek care for testing (follow up with PCP or arrange E-visit).  The importance of social distancing was discussed today.  Time:   Today, I have spent 15 minutes with the patient with telehealth technology discussing the above problems.     Medication Adjustments/Labs and Tests Ordered: Current medicines are reviewed at length with the patient today.  Concerns regarding medicines are outlined above.   Tests Ordered: No orders of the defined types were placed in this encounter.   Medication Changes: No orders of the defined types were placed in this encounter.   Disposition:  Follow up in 6 month(s)  Signed, Garwin Brothers, MD  02/17/2019 10:27 AM    [facility]

## 2019-03-02 ENCOUNTER — Encounter: Payer: Self-pay | Admitting: Family Medicine

## 2019-04-11 ENCOUNTER — Encounter: Payer: Self-pay | Admitting: Family Medicine

## 2019-04-11 ENCOUNTER — Ambulatory Visit (INDEPENDENT_AMBULATORY_CARE_PROVIDER_SITE_OTHER): Payer: Managed Care, Other (non HMO) | Admitting: Family Medicine

## 2019-04-11 ENCOUNTER — Other Ambulatory Visit: Payer: Self-pay

## 2019-04-11 VITALS — BP 107/73 | HR 76 | Ht 64.0 in | Wt 179.0 lb

## 2019-04-11 DIAGNOSIS — F419 Anxiety disorder, unspecified: Secondary | ICD-10-CM

## 2019-04-11 DIAGNOSIS — R5383 Other fatigue: Secondary | ICD-10-CM | POA: Diagnosis not present

## 2019-04-11 NOTE — Progress Notes (Signed)
   Virtual Visit via Video   I connected with patient on 04/11/19 at 10:20 AM EDT by a video enabled telemedicine application and verified that I am speaking with the correct person using two identifiers.  Location patient: Home Location provider: Acupuncturist, Office Persons participating in the virtual visit: Patient, Provider, [place] (Jess B)  I discussed the limitations of evaluation and management by telemedicine and the availability of in person appointments. The patient expressed understanding and agreed to proceed.  Subjective:   HPI:   Fatigue- 'I feel really lethargic and irritable'.  sxs started ~1-2 weeks ago.  Pt denies any recent changes to med, diet.  Pt reports sleeping well.  'I feel like crap'.  Denies sadness.  No menstrual irregularities.  + stress and anxiety.    ROS:   See pertinent positives and negatives per HPI.  Patient Active Problem List   Diagnosis Date Noted  . Chest discomfort 02/10/2019  . History of pericarditis 03/17/2018  . Ganglion cyst of wrist, left 12/21/2016  . GERD (gastroesophageal reflux disease) 11/05/2014  . Obesity, unspecified 12/28/2013  . Routine gynecological examination 09/19/2012  . HSV 06/27/2010    Social History   Tobacco Use  . Smoking status: Never Smoker  . Smokeless tobacco: Never Used  Substance Use Topics  . Alcohol use: Yes    Alcohol/week: 7.0 standard drinks    Types: 7 Glasses of wine per week    Current Outpatient Medications:  .  ibuprofen (ADVIL) 600 MG tablet, Take 600 mg by mouth every 6 (six) hours as needed., Disp: , Rfl:  .  omeprazole (PRILOSEC) 20 MG capsule, Take 1 capsule (20 mg total) by mouth daily., Disp: 30 capsule, Rfl: 3  No Known Allergies  Objective:   BP 107/73   Pulse 76   Ht 5\' 4"  (1.626 m)   Wt 179 lb (81.2 kg)   BMI 30.73 kg/m   AAOx3, NAD NCAT, EOMI No obvious CN deficits Coloring WNL Pt is able to speak clearly, coherently without shortness of breath or  increased work of breathing.  Thought process is linear.  Mood is appropriate.   Assessment and Plan:   Fatigue- new.  Suspect this is multifactorial but most likely stress/anxiety related.  Will check labs to r/o metabolic cause but discussed at length the contribution of anxiety/stress on physical well being.  Pt wants to hold on meds until labs are available to review.  Will follow.  Anxiety- new. pt is struggling w/ COVID and also BLM.  Living in [place] she feels that racism remains a large problem.  Discussed that she has plenty of things to currently be anxious about.  Talked about starting medication.  Pt would like to see lab results prior to starting meds.  Will follow.  Annye Asa, MD 04/11/2019

## 2019-04-11 NOTE — Progress Notes (Signed)
I have discussed the procedure for the virtual visit with the patient who has given consent to proceed with assessment and treatment.   Jessica L Brodmerkel, CMA     

## 2019-04-12 ENCOUNTER — Encounter: Payer: Self-pay | Admitting: Family Medicine

## 2019-04-13 ENCOUNTER — Other Ambulatory Visit (INDEPENDENT_AMBULATORY_CARE_PROVIDER_SITE_OTHER): Payer: Managed Care, Other (non HMO)

## 2019-04-13 ENCOUNTER — Other Ambulatory Visit: Payer: Self-pay | Admitting: Emergency Medicine

## 2019-04-13 DIAGNOSIS — R5383 Other fatigue: Secondary | ICD-10-CM

## 2019-04-13 LAB — HEPATIC FUNCTION PANEL
ALT: 13 U/L (ref 0–35)
AST: 14 U/L (ref 0–37)
Albumin: 4.2 g/dL (ref 3.5–5.2)
Alkaline Phosphatase: 73 U/L (ref 39–117)
Bilirubin, Direct: 0.1 mg/dL (ref 0.0–0.3)
Total Bilirubin: 0.4 mg/dL (ref 0.2–1.2)
Total Protein: 6.9 g/dL (ref 6.0–8.3)

## 2019-04-13 LAB — BASIC METABOLIC PANEL
BUN: 13 mg/dL (ref 6–23)
CO2: 27 mEq/L (ref 19–32)
Calcium: 9.3 mg/dL (ref 8.4–10.5)
Chloride: 105 mEq/L (ref 96–112)
Creatinine, Ser: 0.67 mg/dL (ref 0.40–1.20)
GFR: 122.27 mL/min (ref >60.00)
Glucose, Bld: 85 mg/dL (ref 70–99)
Potassium: 4.3 mEq/L (ref 3.5–5.1)
Sodium: 138 mEq/L (ref 135–145)

## 2019-04-13 NOTE — Progress Notes (Signed)
FH

## 2019-04-14 ENCOUNTER — Other Ambulatory Visit: Payer: Self-pay

## 2019-04-14 LAB — CBC WITH DIFFERENTIAL/PLATELET
Basophils Absolute: 0 10*3/uL (ref 0.0–0.1)
Basophils Relative: 0.4 % (ref 0.0–3.0)
Eosinophils Absolute: 0.1 10*3/uL (ref 0.0–0.7)
Eosinophils Relative: 1.4 % (ref 0.0–5.0)
HCT: 38.9 % (ref 36.0–46.0)
Hemoglobin: 12.6 g/dL (ref 12.0–15.0)
Lymphocytes Relative: 42.7 % (ref 12.0–46.0)
Lymphs Abs: 3.3 10*3/uL (ref 0.7–4.0)
MCHC: 32.5 g/dL (ref 30.0–36.0)
MCV: 86 fl (ref 78.0–100.0)
Monocytes Absolute: 0.8 10*3/uL (ref 0.1–1.0)
Monocytes Relative: 10.8 % (ref 3.0–12.0)
Neutro Abs: 3.5 10*3/uL (ref 1.4–7.7)
Neutrophils Relative %: 44.7 % (ref 43.0–77.0)
Platelets: 274 10*3/uL (ref 150.0–400.0)
RBC: 4.53 Mil/uL (ref 3.87–5.11)
RDW: 12.3 % (ref 11.5–15.5)
WBC: 7.7 10*3/uL (ref 4.0–10.5)

## 2019-04-14 LAB — VITAMIN D 25 HYDROXY (VIT D DEFICIENCY, FRACTURES): VITD: 25.55 ng/mL — ABNORMAL LOW (ref 30.00–100.00)

## 2019-04-14 LAB — TSH: TSH: 0.8 u[IU]/mL (ref 0.35–4.50)

## 2019-04-14 LAB — B12 AND FOLATE PANEL
Folate: 16.3 ng/mL (ref >5.9)
Vitamin B-12: 855 pg/mL (ref 211–911)

## 2019-04-17 ENCOUNTER — Other Ambulatory Visit: Payer: Self-pay | Admitting: General Practice

## 2019-04-17 MED ORDER — VITAMIN D (ERGOCALCIFEROL) 1.25 MG (50000 UNIT) PO CAPS: 50000.0000 [IU] | ORAL_CAPSULE | ORAL | 0 refills | Status: DC

## 2019-04-18 ENCOUNTER — Encounter: Payer: Self-pay | Admitting: Family Medicine

## 2019-04-19 ENCOUNTER — Other Ambulatory Visit: Payer: Self-pay | Admitting: Family

## 2019-04-19 ENCOUNTER — Ambulatory Visit: Payer: Managed Care, Other (non HMO)

## 2019-04-19 ENCOUNTER — Other Ambulatory Visit: Payer: Self-pay

## 2019-04-19 DIAGNOSIS — R5383 Other fatigue: Secondary | ICD-10-CM

## 2019-04-20 LAB — LUTEINIZING HORMONE: LH: 5.6 m[IU]/mL

## 2019-04-20 LAB — FOLLICLE STIMULATING HORMONE: FSH: 6 m[IU]/mL

## 2019-07-10 ENCOUNTER — Other Ambulatory Visit: Payer: Self-pay | Admitting: Family Medicine

## 2019-08-22 ENCOUNTER — Encounter: Payer: Self-pay | Admitting: Cardiology

## 2019-08-22 ENCOUNTER — Other Ambulatory Visit: Payer: Self-pay

## 2019-08-22 ENCOUNTER — Ambulatory Visit (INDEPENDENT_AMBULATORY_CARE_PROVIDER_SITE_OTHER): Payer: Managed Care, Other (non HMO) | Admitting: Cardiology

## 2019-08-22 VITALS — BP 118/84 | HR 84 | Ht 65.0 in | Wt 184.0 lb

## 2019-08-22 DIAGNOSIS — Z8679 Personal history of other diseases of the circulatory system: Secondary | ICD-10-CM | POA: Diagnosis not present

## 2019-08-22 NOTE — Patient Instructions (Signed)
Medication Instructions:  Your physician recommends that you continue on your current medications as directed. Please refer to the Current Medication list given to you today.  *If you need a refill on your cardiac medications before your next appointment, please call your pharmacy*  Lab Work: NONE If you have labs (blood work) drawn today and your tests are completely normal, you will receive your results only by: . MyChart Message (if you have MyChart) OR . A paper copy in the mail If you have any lab test that is abnormal or we need to change your treatment, we will call you to review the results.  Testing/Procedures: NONE  Follow-Up: At [facility], you and your health needs are our priority.  As part of our continuing mission to provide you with exceptional heart care, we have created designated Provider Care Teams.  These Care Teams include your primary Cardiologist (physician) and Advanced Practice Providers (APPs -  Physician Assistants and Nurse Practitioners) who all work together to provide you with the care you need, when you need it.  Your next appointment:   9 month(s)  The format for your next appointment:   In Person  Provider:   Rajan Revankar, MD   

## 2019-08-22 NOTE — Progress Notes (Signed)
Cardiology Office Note:    Date:  08/22/2019   ID:  Janice Raymond, DOB Sep 24, 1986, MRN [id number]  PCP:  Midge Minium, MD  Cardiologist:  Jenean Lindau, MD   Referring MD: Midge Minium, MD    ASSESSMENT:    1. History of pericarditis    PLAN:    In order of problems listed above:  1. History of pericarditis: Patient is stable from this perspective and denies any problems.  No chest pain orthopnea or PND.  She is an active lady and leads a normal lifestyle.  She has a full-time job.  I reassured her about my findings today.Patient will be seen in follow-up appointment in 9 months or earlier if the patient has any concerns    Medication Adjustments/Labs and Tests Ordered: Current medicines are reviewed at length with the patient today.  Concerns regarding medicines are outlined above.  No orders of the defined types were placed in this encounter.  No orders of the defined types were placed in this encounter.    No chief complaint on file.    History of Present Illness:    Janice Raymond is a 33 y.o. female.  Patient is here for follow-up.  She has had episodes of pericarditis in the past and has been treated.  Subsequently she has done fine.  Currently she has had no such issues.  No chest pain orthopnea or PND.  At the time of my evaluation, the patient is alert awake oriented and in no distress.  Past Medical History:  Diagnosis Date  . Acute pericarditis, unspecified 12/01/2017  . Fatigue 11/05/2014  . Ganglion cyst of wrist, left 12/21/2016  . GERD (gastroesophageal reflux disease) 11/05/2014  . LLQ pain 01/16/2013  . Obesity, unspecified 12/28/2013  . Postpartum care following vaginal delivery (8/26) 06/21/2016  . Routine gynecological examination 09/19/2012  . Second-degree perineal laceration, with delivery 06/21/2016    Past Surgical History:  Procedure Laterality Date  . NO PAST SURGERIES    . TONSILLECTOMY      Current Medications: Current  Meds  Medication Sig  . fluticasone (FLONASE) 50 MCG/ACT nasal spray Place into the nose.  . ibuprofen (ADVIL) 600 MG tablet Take 600 mg by mouth every 6 (six) hours as needed.  Marland Kitchen omeprazole (PRILOSEC) 20 MG capsule TAKE 1 CAPSULE BY MOUTH EVERY DAY  . Vitamin D, Ergocalciferol, (DRISDOL) 1.25 MG (50000 UT) CAPS capsule Take 1 capsule (50,000 Units total) by mouth every 7 (seven) days.     Allergies:   Patient has no known allergies.   Social History   Socioeconomic History  . Marital status: Married    Spouse name: Not on file  . Number of children: Not on file  . Years of education: Not on file  . Highest education level: Not on file  Occupational History  . Not on file  Social Needs  . Financial resource strain: Not on file  . Food insecurity    Worry: Not on file    Inability: Not on file  . Transportation needs    Medical: Not on file    Non-medical: Not on file  Tobacco Use  . Smoking status: Never Smoker  . Smokeless tobacco: Never Used  Substance and Sexual Activity  . Alcohol use: Yes    Alcohol/week: 7.0 standard drinks    Types: 7 Glasses of wine per week  . Drug use: No  . Sexual activity: Yes    Birth control/protection: Other-see comments  Comment: Husband had vasectomy  Lifestyle  . Physical activity    Days per week: Not on file    Minutes per session: Not on file  . Stress: Not on file  Relationships  . Social Musician on phone: Not on file    Gets together: Not on file    Attends religious service: Not on file    Active member of club or organization: Not on file    Attends meetings of clubs or organizations: Not on file    Relationship status: Not on file  Other Topics Concern  . Not on file  Social History Narrative   One son born 2017   Has worked at [employer]   Enjoys sleeping, thrift shopping   Married     Family History: The patient's family history includes Breast cancer in her maternal aunt, maternal aunt,  and maternal aunt; Depression in her mother; Diabetes in her mother; Prostate cancer in her paternal uncle and paternal uncle; Schizophrenia in her mother; Vitamin D deficiency in her father.  ROS:   Please see the history of present illness.    All other systems reviewed and are negative.  EKGs/Labs/Other Studies Reviewed:    The following studies were reviewed today: I discussed my findings with the patient at extensive length.   Recent Labs: 04/13/2019: ALT 13; BUN 13; Creatinine, Ser 0.67; Hemoglobin 12.6; Platelets 274.0; Potassium 4.3; Sodium 138; TSH 0.80  Recent Lipid Panel    Component Value Date/Time   CHOL 184 02/08/2017 1619   TRIG 155.0 (H) 02/08/2017 1619   HDL 72.40 02/08/2017 1619   CHOLHDL 3 02/08/2017 1619   VLDL 31.0 02/08/2017 1619   LDLCALC 80 02/08/2017 1619   LDLDIRECT 95.3 09/19/2012 1116    Physical Exam:    VS:  BP 118/84 (BP Location: Left Arm, Patient Position: Sitting, Cuff Size: Normal)   Pulse 84   Ht 5\' 5"  (1.651 m)   Wt 184 lb 0.6 oz (83.5 kg)   SpO2 100%   BMI 30.63 kg/m     Wt Readings from Last 3 Encounters:  08/22/19 184 lb 0.6 oz (83.5 kg)  04/11/19 179 lb (81.2 kg)  02/17/19 175 lb (79.4 kg)     GEN: Patient is in no acute distress HEENT: Normal NECK: No JVD; No carotid bruits LYMPHATICS: No lymphadenopathy CARDIAC: Hear sounds regular, 2/6 systolic murmur at the apex. RESPIRATORY:  Clear to auscultation without rales, wheezing or rhonchi  ABDOMEN: Soft, non-tender, non-distended MUSCULOSKELETAL:  No edema; No deformity  SKIN: Warm and dry NEUROLOGIC:  Alert and oriented x 3 PSYCHIATRIC:  Normal affect   Signed, 02/19/19, MD  08/22/2019 10:16 AM    [facility]

## 2019-11-09 ENCOUNTER — Other Ambulatory Visit: Payer: Self-pay

## 2019-11-10 ENCOUNTER — Ambulatory Visit: Payer: Managed Care, Other (non HMO) | Admitting: Family Medicine

## 2019-11-10 DIAGNOSIS — Z0289 Encounter for other administrative examinations: Secondary | ICD-10-CM

## 2019-11-22 ENCOUNTER — Other Ambulatory Visit: Payer: Self-pay | Admitting: Family

## 2020-01-25 ENCOUNTER — Other Ambulatory Visit: Payer: Self-pay | Admitting: Family

## 2020-01-25 NOTE — Telephone Encounter (Signed)
Please advise 

## 2020-02-19 ENCOUNTER — Telehealth: Payer: Self-pay | Admitting: Family Medicine

## 2020-02-19 NOTE — Telephone Encounter (Signed)
Pt is aware.  

## 2020-02-19 NOTE — Telephone Encounter (Signed)
Pt called in asking for an in office visit with Tabori for left side pain. She has no covid symptoms. Please advise

## 2020-02-19 NOTE — Telephone Encounter (Signed)
Ok to schedule next available or she can see Selena Batten if she needs to be seen sooner

## 2020-02-21 ENCOUNTER — Ambulatory Visit: Payer: Managed Care, Other (non HMO) | Admitting: Family Medicine

## 2020-02-21 ENCOUNTER — Encounter: Payer: Self-pay | Admitting: Family Medicine

## 2020-02-21 ENCOUNTER — Other Ambulatory Visit: Payer: Self-pay

## 2020-02-21 VITALS — BP 120/79 | HR 92 | Temp 97.9°F | Resp 16 | Ht 65.0 in | Wt 186.4 lb

## 2020-02-21 DIAGNOSIS — R5383 Other fatigue: Secondary | ICD-10-CM | POA: Diagnosis not present

## 2020-02-21 DIAGNOSIS — K5900 Constipation, unspecified: Secondary | ICD-10-CM

## 2020-02-21 HISTORY — DX: Constipation, unspecified: K59.00

## 2020-02-21 LAB — HEPATIC FUNCTION PANEL
ALT: 18 U/L (ref 0–35)
AST: 19 U/L (ref 0–37)
Albumin: 4.3 g/dL (ref 3.5–5.2)
Alkaline Phosphatase: 80 U/L (ref 39–117)
Bilirubin, Direct: 0.1 mg/dL (ref 0.0–0.3)
Total Bilirubin: 0.7 mg/dL (ref 0.2–1.2)
Total Protein: 7 g/dL (ref 6.0–8.3)

## 2020-02-21 LAB — BASIC METABOLIC PANEL
BUN: 11 mg/dL (ref 6–23)
CO2: 30 mEq/L (ref 19–32)
Calcium: 9.4 mg/dL (ref 8.4–10.5)
Chloride: 104 mEq/L (ref 96–112)
Creatinine, Ser: 0.61 mg/dL (ref 0.40–1.20)
GFR: 135.55 mL/min (ref >60.00)
Glucose, Bld: 80 mg/dL (ref 70–99)
Potassium: 4.6 mEq/L (ref 3.5–5.1)
Sodium: 139 mEq/L (ref 135–145)

## 2020-02-21 LAB — VITAMIN D 25 HYDROXY (VIT D DEFICIENCY, FRACTURES): VITD: 40.39 ng/mL (ref 30.00–100.00)

## 2020-02-21 LAB — TSH: TSH: 0.54 u[IU]/mL (ref 0.35–4.50)

## 2020-02-21 MED ORDER — POLYETHYLENE GLYCOL 3350 17 GM/SCOOP PO POWD: 17.0000 g | Freq: Every day | ORAL | 1 refills | Status: DC

## 2020-02-21 NOTE — Patient Instructions (Signed)
Follow up by MyChart in 2-3 weeks and let me know how things are going We'll notify you of your lab results and make any changes if needed START the Miralax once daily.  If the stools become too frequent or watery, decrease the amount of Miralax to every other day Continue to drink LOTS of water Regular exercise also helps w/ constipation Call with any questions or concerns Hang in there!

## 2020-02-21 NOTE — Assessment & Plan Note (Signed)
New.  Pt has had sxs x1 month.  Temporary relief w/ OTC products but this will then cause diarrhea.  Will check labs to r/o underlying cause and start daily Miralax.  If no improvement, will refer to GI.  Pt expressed understanding and is in agreement w/ plan.

## 2020-02-21 NOTE — Progress Notes (Signed)
   Subjective:    Patient ID: Janice Raymond, female    DOB: Oct 10, 1986, 34 y.o.   MRN: [id number]  HPI L sided abd pain- 'I think i'm constipated'.  sxs started ~1 month ago.  Took Mag Citrate a few weeks ago that 'gave me diarrhea'.  Has been taking Dulcolax daily w/ some relief.  Yesterday had what appeared to be normal BM prior to diarrhea.  Having BMs 2-3x/week.  Does find herself needing to strain at times.  Pt admits that she needs to drink more water.  No N/V.  Pt having LUQ distension and 'fluttering' at times.  Distension will improve w/ BM but returns fairly quickly.  + fatigue- pt having trouble w/ low energy.  34 yr old is not sleeping through the night.  Working full time, in school.  Denies changes to skin/hair/nails.   Review of Systems For ROS see HPI   This visit occurred during the SARS-CoV-2 public health emergency.  Safety protocols were in place, including screening questions prior to the visit, additional usage of staff PPE, and extensive cleaning of exam room while observing appropriate contact time as indicated for disinfecting solutions.       Objective:   Physical Exam Vitals reviewed.  Constitutional:      General: She is not in acute distress.    Appearance: She is well-developed. She is obese. She is not ill-appearing.  HENT:     Head: Normocephalic and atraumatic.  Abdominal:     General: Abdomen is flat. Bowel sounds are normal. There is no distension.     Palpations: Abdomen is soft. There is no hepatomegaly, splenomegaly or mass.     Tenderness: There is no abdominal tenderness. There is no guarding or rebound.  Skin:    General: Skin is warm and dry.  Neurological:     Mental Status: She is alert.           Assessment & Plan:  Fatigue- new.  likely multifactorial (son not sleeping through the night, working full time, in school) but check labs to r/o metabolic cause.  Will treat any abnormalities if present.

## 2020-02-22 LAB — CBC WITH DIFFERENTIAL/PLATELET
Basophils Absolute: 0 10*3/uL (ref 0.0–0.1)
Basophils Relative: 0.5 % (ref 0.0–3.0)
Eosinophils Absolute: 0.1 10*3/uL (ref 0.0–0.7)
Eosinophils Relative: 1.7 % (ref 0.0–5.0)
HCT: 37.6 % (ref 36.0–46.0)
Hemoglobin: 12.4 g/dL (ref 12.0–15.0)
Lymphocytes Relative: 37.2 % (ref 12.0–46.0)
Lymphs Abs: 2.2 10*3/uL (ref 0.7–4.0)
MCHC: 33 g/dL (ref 30.0–36.0)
MCV: 85.4 fl (ref 78.0–100.0)
Monocytes Absolute: 0.6 10*3/uL (ref 0.1–1.0)
Monocytes Relative: 9.7 % (ref 3.0–12.0)
Neutro Abs: 3 10*3/uL (ref 1.4–7.7)
Neutrophils Relative %: 50.9 % (ref 43.0–77.0)
Platelets: 297 10*3/uL (ref 150.0–400.0)
RBC: 4.41 Mil/uL (ref 3.87–5.11)
RDW: 13 % (ref 11.5–15.5)
WBC: 5.9 10*3/uL (ref 4.0–10.5)

## 2020-02-23 ENCOUNTER — Telehealth: Payer: Self-pay

## 2020-02-23 NOTE — Telephone Encounter (Signed)
Patient notified of PCP recommendations and is agreement and expresses an understanding.  

## 2020-02-23 NOTE — Telephone Encounter (Signed)
TSH is actually great.  TSH is an inverse relationship to thyroid function.  Low TSH means more robust thyroid function.  Higher TSH means low thyroid function.  No need to do anything at this time

## 2020-02-23 NOTE — Telephone Encounter (Signed)
Patient called in stating that she has noticed a decline in her TSH levels over the past several years. Wants to know if PCP recommends her starting a medication or something OTC. Please advise

## 2020-02-28 ENCOUNTER — Encounter: Payer: Self-pay | Admitting: Family Medicine

## 2020-03-10 ENCOUNTER — Encounter: Payer: Self-pay | Admitting: Family Medicine

## 2020-04-17 ENCOUNTER — Ambulatory Visit: Payer: Managed Care, Other (non HMO) | Admitting: Family Medicine

## 2020-04-17 ENCOUNTER — Encounter: Payer: Self-pay | Admitting: Family Medicine

## 2020-04-17 ENCOUNTER — Other Ambulatory Visit: Payer: Self-pay

## 2020-04-17 VITALS — BP 119/82 | HR 74 | Temp 98.0°F | Resp 16 | Wt 190.2 lb

## 2020-04-17 DIAGNOSIS — K2970 Gastritis, unspecified, without bleeding: Secondary | ICD-10-CM

## 2020-04-17 DIAGNOSIS — Z566 Other physical and mental strain related to work: Secondary | ICD-10-CM | POA: Diagnosis not present

## 2020-04-17 DIAGNOSIS — K589 Irritable bowel syndrome without diarrhea: Secondary | ICD-10-CM

## 2020-04-17 DIAGNOSIS — R109 Unspecified abdominal pain: Secondary | ICD-10-CM

## 2020-04-17 MED ORDER — OMEPRAZOLE 40 MG PO CPDR
40.0000 mg | DELAYED_RELEASE_CAPSULE | Freq: Every day | ORAL | 3 refills | Status: DC
Start: 2020-04-17 — End: 2020-10-08

## 2020-04-17 MED ORDER — DICYCLOMINE HCL 20 MG PO TABS: 20.0000 mg | ORAL_TABLET | Freq: Three times a day (TID) | ORAL | 1 refills | Status: AC | PRN

## 2020-04-17 NOTE — Progress Notes (Signed)
   Subjective:    Patient ID: Janice Raymond, female    DOB: 02/12/1986, 34 y.o.   MRN: [id number]  HPI L Flank pain- 'miralax did help some' but 'I don't think it's constipation'.  Pt had GYN appt last week and [place] of uterus- shown to have uterine polyp.  Pt reports pain is there, 'more often than not' and is typically a dull pain.  As she has more stress, pain intensifies.  No N/V.  Pain resolves spontaneously.  Not associated w/ BMs.  No relation to food.  Continues to take PPI daily.  Did take H2 blocker on Monday when pain was severe and this helped calm sxs.  No burning w/ urination, no blood in urine.  Taking ASA and ibuprofen regularly.  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   Review of Systems For ROS see HPI   This visit occurred during the SARS-CoV-2 public health emergency.  Safety protocols were in place, including screening questions prior to the visit, additional usage of staff PPE, and extensive cleaning of exam room while observing appropriate contact time as indicated for disinfecting solutions.       Objective:   Physical Exam Vitals reviewed.  Constitutional:      General: She is not in acute distress.    Appearance: Normal appearance. She is not ill-appearing.  HENT:     Head: Normocephalic and atraumatic.  Abdominal:     General: There is no distension.     Palpations: There is no mass.     Tenderness: There is abdominal tenderness (TTP over L flank in mid-axillary line but no palpable abnormality). There is no right CVA tenderness, left CVA tenderness, guarding or rebound.  Neurological:     Mental Status: She is alert.           Assessment & Plan:  IBS- pt's worsening abdominal/flank pain in setting of stress and w/ history of constipation sounds consistent w/ likely IBS.  Reviewed dx and sxs with pt.  Encouraged increased fluids, fiber, regular exercise, stress management.  Will start Bentyl for 1 week and then as  needed.  Pt expressed understanding and is in agreement w/ plan.   Gastritis- pt admits to increased ASA and ibuprofen use.  Drinks wine regularly.  Will increase PPI to 40mg  daily  Work stress- deteriorated.  Pt is very stressed by current work situation and summer school just started which makes things even more hectic.  Discussed possibility of medication but pt doesn't want to take anything that would interfere with her wine.  Discussed massage, alone time, and other stress management tools.  Will follow.

## 2020-04-17 NOTE — Patient Instructions (Signed)
Follow up in 2-3 weeks to recheck pain INCREASE the Omeprazole to 40mg  daily- new prescription sent USE the Dicyclomine 3x/day x1 week and then as needed for abdominal spasms Try and limit your ibuprofen and aspirin intake Make sure you are taking time for yourself! A massage is a great way to decrease your stress level! Call with any questions or concerns Hang in there!!

## 2020-04-23 ENCOUNTER — Encounter: Payer: Self-pay | Admitting: Family Medicine

## 2020-04-23 DIAGNOSIS — R109 Unspecified abdominal pain: Secondary | ICD-10-CM

## 2020-04-24 NOTE — Telephone Encounter (Signed)
Pt called in stating that she is still having the stomach pain, she would like to more forward to have an xray or [place] to try to find the root cause of this. Pt would like to have this done at the [facility]. Please advise and call pt at the home #

## 2020-05-01 ENCOUNTER — Other Ambulatory Visit: Payer: Self-pay | Admitting: Physician Assistant

## 2020-05-13 ENCOUNTER — Other Ambulatory Visit: Payer: Self-pay | Admitting: Physician Assistant

## 2020-05-13 ENCOUNTER — Ambulatory Visit (HOSPITAL_BASED_OUTPATIENT_CLINIC_OR_DEPARTMENT_OTHER)
Admission: RE | Admit: 2020-05-13 | Discharge: 2020-05-13 | Disposition: A | Discharge status: Home / Self Care | Payer: Managed Care, Other (non HMO) | Source: Ambulatory Visit | Attending: Family Medicine | Admitting: Family Medicine

## 2020-05-13 ENCOUNTER — Other Ambulatory Visit: Payer: Self-pay

## 2020-05-13 DIAGNOSIS — R109 Unspecified abdominal pain: Secondary | ICD-10-CM | POA: Insufficient documentation

## 2020-05-20 ENCOUNTER — Encounter: Payer: Self-pay | Admitting: Family Medicine

## 2020-05-20 ENCOUNTER — Telehealth: Payer: Self-pay | Admitting: Cardiology

## 2020-05-20 NOTE — Telephone Encounter (Signed)
She can get a letter from her primary care physician that she has had pericarditis in the past.  However employer will respond to that is up to them.  She will have to discuss this with her employer at length.

## 2020-05-20 NOTE — Telephone Encounter (Signed)
Called patient informed her Dr. Kem Parkinson message below. She verbally understood. No further questions.

## 2020-05-20 NOTE — Telephone Encounter (Signed)
Janice Raymond is calling in regards to the patient message she sent on 05/17/20 in regards to getting the vaccine, due to never hearing anything back. Please advise.

## 2020-07-09 ENCOUNTER — Encounter: Payer: Self-pay | Admitting: Physician Assistant

## 2020-07-09 ENCOUNTER — Other Ambulatory Visit: Payer: Self-pay

## 2020-07-09 ENCOUNTER — Telehealth (INDEPENDENT_AMBULATORY_CARE_PROVIDER_SITE_OTHER): Payer: Managed Care, Other (non HMO) | Admitting: Physician Assistant

## 2020-07-09 DIAGNOSIS — R0981 Nasal congestion: Secondary | ICD-10-CM | POA: Diagnosis not present

## 2020-07-09 MED ORDER — LEVOCETIRIZINE DIHYDROCHLORIDE 5 MG PO TABS: 5.0000 mg | ORAL_TABLET | Freq: Every evening | ORAL | 2 refills | Status: DC

## 2020-07-09 MED ORDER — FLUTICASONE PROPIONATE 50 MCG/ACT NA SUSP: 2.0000 | Freq: Every day | NASAL | 2 refills | Status: DC

## 2020-07-09 NOTE — Progress Notes (Signed)
I have discussed the procedure for the virtual visit with the patient who has given consent to proceed with assessment and treatment.   Herold Salguero S Bingham Millette, CMA     

## 2020-07-09 NOTE — Progress Notes (Signed)
   Virtual Visit via Video   I connected with patient on 07/09/20 at 11:30 AM EDT by a video enabled telemedicine application and verified that I am speaking with the correct person using two identifiers.  Location patient: Home Location provider: [facility] Persons participating in the virtual visit: Patient, Provider, CMA (Patina Moore)  I discussed the limitations of evaluation and management by telemedicine and the availability of in person appointments. The patient expressed understanding and agreed to proceed.  Subjective:   HPI:   Patient presents via [facility] today c/o 4 days of nasal congestion, rhinorrhea, scratchy throat, sinus pressure. Denies fever, chills, body aches. Denies chest tightness or wheezing. Denies any sinus pain or tooth pain. Does note bilateral ear pressure and popping. Patient has had both COVID vaccines.  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 Son has similar symptoms and was treated for sinus infection. Is awaiting COVID testing results.   ROS:   See pertinent positives and negatives per HPI.  Patient Active Problem List   Diagnosis Date Noted  . Constipation 02/21/2020  . Chest discomfort 02/10/2019  . History of pericarditis 03/17/2018  . Ganglion cyst of wrist, left 12/21/2016  . GERD (gastroesophageal reflux disease) 11/05/2014  . Obesity, unspecified 12/28/2013  . Routine gynecological examination 09/19/2012  . HSV 06/27/2010    Social History   Tobacco Use  . Smoking status: Never Smoker  . Smokeless tobacco: Never Used  Substance Use Topics  . Alcohol use: Yes    Alcohol/week: 7.0 standard drinks    Types: 7 Glasses of wine per week    Current Outpatient Medications:  .  dicyclomine (BENTYL) 20 MG tablet, Take 1 tablet (20 mg total) by mouth 3 (three) times daily as needed for spasms., Disp: 90 tablet, Rfl: 1 .  omeprazole (PRILOSEC) 40 MG capsule, Take 1 capsule (40 mg total) by mouth  daily., Disp: 30 capsule, Rfl: 3 .  polyethylene glycol powder (GLYCOLAX/MIRALAX) 17 GM/SCOOP powder, Take 17 g by mouth daily. (Patient not taking: Reported on 07/09/2020), Disp: 3350 g, Rfl: 1  No Known Allergies  Objective:   There were no vitals taken for this visit.  Patient is well-developed, well-nourished in no acute distress.  Resting comfortably at home.  Head is normocephalic, atraumatic.  No labored breathing.  Speech is clear and coherent with logical content.  Patient is alert and oriented at baseline.   Assessment and Plan:   1. Nasal congestion Question mild viral illness versus allergic inflammation. Does not seem COVID related giving no known exposure and mostly allergic symptoms. However, her Son tested for COVID due to runny nose. Results pending. She is to remain quarantine until son's results are in. Her symptoms are mainly nasal congestion, runny nose, itchy throat. Will have her start antihistamine -- Xyzal -- and restart nasal steroid (Flonase) daily. Saline nasal rinses recommended. She is to notify us ASAP of any new symptoms. If not improving or new symptoms develop and son's test negative, would recommend having her tested and consider starting antibiotic therapy.     Piedad Climes, PA-C 07/09/2020

## 2020-07-10 ENCOUNTER — Encounter: Payer: Self-pay | Admitting: Physician Assistant

## 2020-07-11 MED ORDER — AMOXICILLIN 875 MG PO TABS
875.0000 mg | ORAL_TABLET | Freq: Two times a day (BID) | ORAL | 0 refills | Status: DC
Start: 2020-07-11 — End: 2023-06-29

## 2020-08-13 ENCOUNTER — Encounter: Payer: Self-pay | Admitting: Family Medicine

## 2020-08-27 ENCOUNTER — Encounter: Payer: Self-pay | Admitting: Family Medicine

## 2020-09-27 ENCOUNTER — Other Ambulatory Visit: Payer: Self-pay | Admitting: Family Medicine

## 2020-10-07 DIAGNOSIS — Z9889 Other specified postprocedural states: Secondary | ICD-10-CM

## 2020-10-07 HISTORY — DX: Other specified postprocedural states: Z98.890

## 2020-10-08 ENCOUNTER — Other Ambulatory Visit: Payer: Self-pay | Admitting: Physician Assistant

## 2020-10-08 ENCOUNTER — Other Ambulatory Visit: Payer: Self-pay | Admitting: Family Medicine

## 2020-11-12 ENCOUNTER — Ambulatory Visit: Payer: Managed Care, Other (non HMO) | Admitting: Cardiology

## 2020-11-14 ENCOUNTER — Ambulatory Visit: Payer: Managed Care, Other (non HMO) | Admitting: Cardiology

## 2020-12-06 DIAGNOSIS — A6000 Herpesviral infection of urogenital system, unspecified: Secondary | ICD-10-CM | POA: Insufficient documentation

## 2020-12-06 HISTORY — DX: Herpesviral infection of urogenital system, unspecified: A60.00

## 2021-03-07 DIAGNOSIS — K589 Irritable bowel syndrome without diarrhea: Secondary | ICD-10-CM

## 2021-03-07 HISTORY — DX: Irritable bowel syndrome, unspecified: K58.9

## 2021-04-10 ENCOUNTER — Other Ambulatory Visit: Payer: Self-pay

## 2021-04-10 DIAGNOSIS — R0981 Nasal congestion: Secondary | ICD-10-CM

## 2021-04-10 MED ORDER — FLUTICASONE PROPIONATE 50 MCG/ACT NA SUSP: 2.0000 | Freq: Every day | NASAL | 0 refills | Status: AC

## 2021-05-05 ENCOUNTER — Other Ambulatory Visit: Payer: Self-pay | Admitting: Family

## 2021-05-05 DIAGNOSIS — R0981 Nasal congestion: Secondary | ICD-10-CM

## 2021-07-23 DIAGNOSIS — F419 Anxiety disorder, unspecified: Secondary | ICD-10-CM | POA: Insufficient documentation

## 2021-07-23 HISTORY — DX: Anxiety disorder, unspecified: F41.9

## 2021-11-17 DIAGNOSIS — Z8041 Family history of malignant neoplasm of ovary: Secondary | ICD-10-CM

## 2021-11-17 HISTORY — DX: Family history of malignant neoplasm of ovary: Z80.41

## 2022-06-16 IMAGING — US US ABDOMEN COMPLETE
2 series · 14 of 25 positions shown · non-contrast
Comparison: None.

CLINICAL DATA: Flank pain

EXAM:
ABDOMEN ULTRASOUND COMPLETE

[Series 1: us abdomen complete · 13 of 61 slices shown (1 of 2)]
[im 1/61]
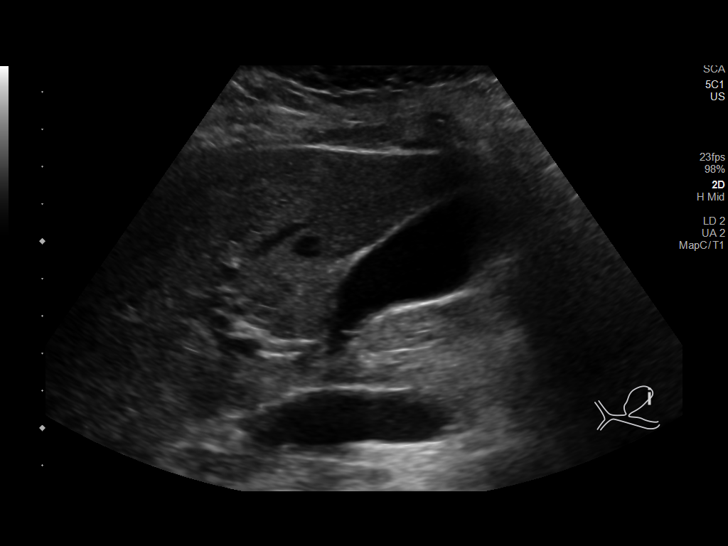
[im 6/61]
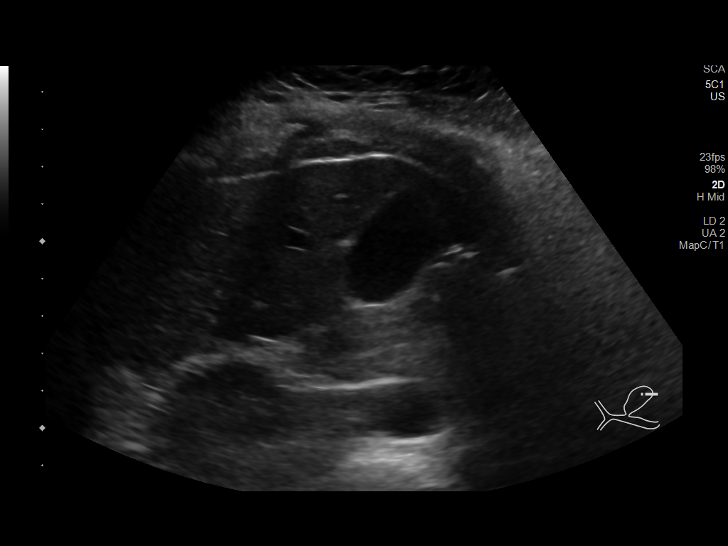
[im 11/61]
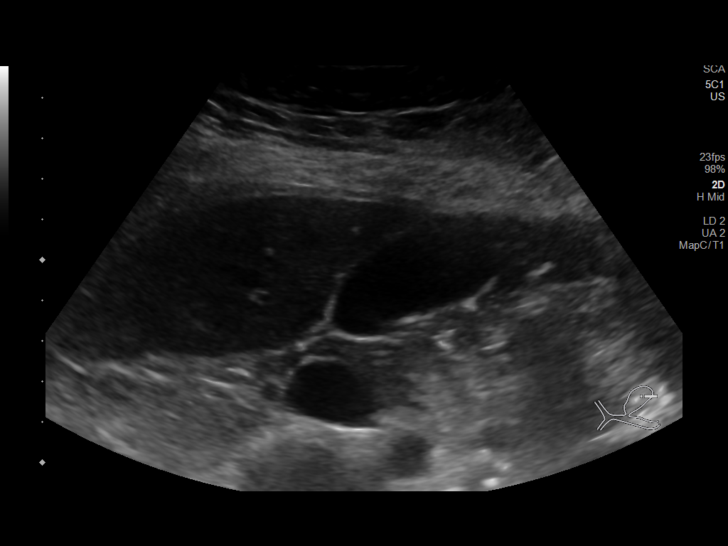
[im 16/61]
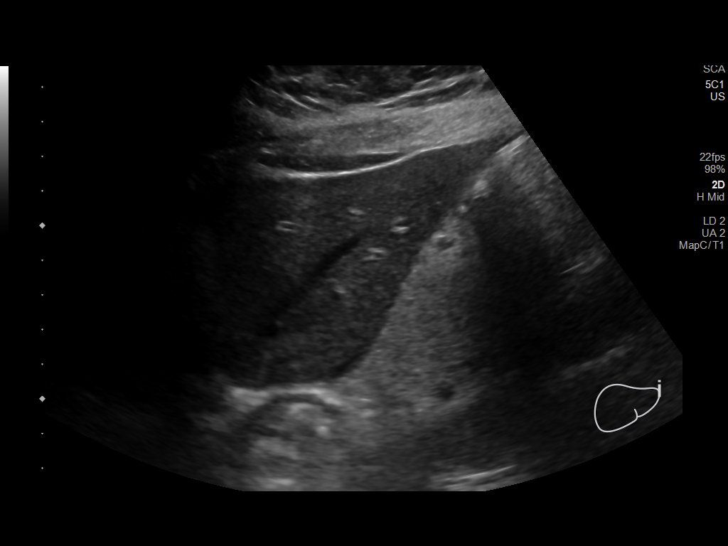
[im 21/61]
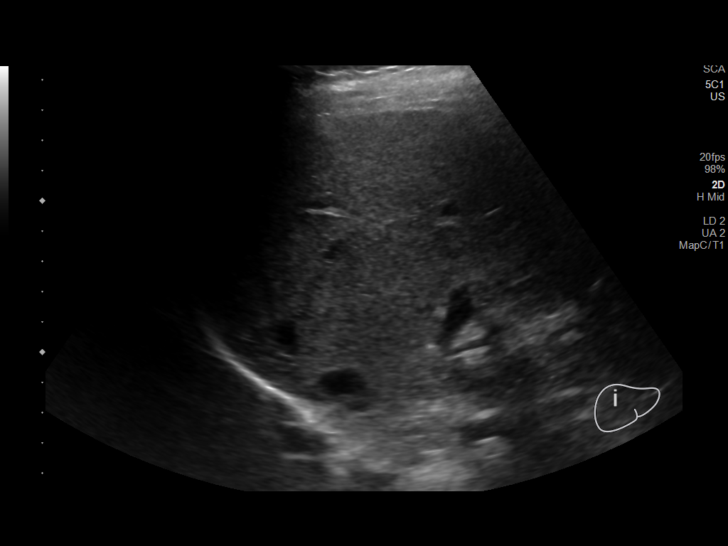
[im 24/61]
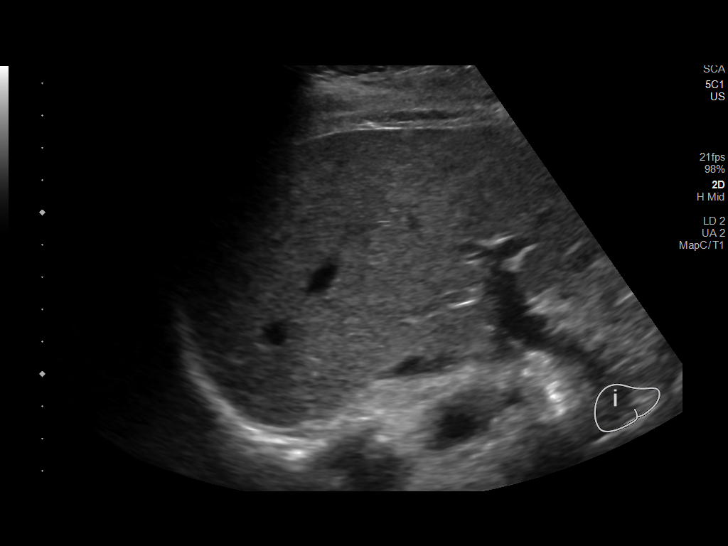
[im 29/61]
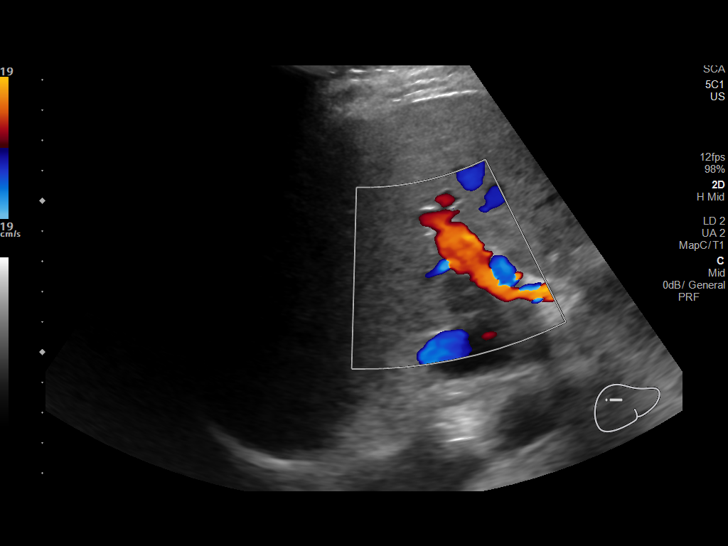
[im 34/61]
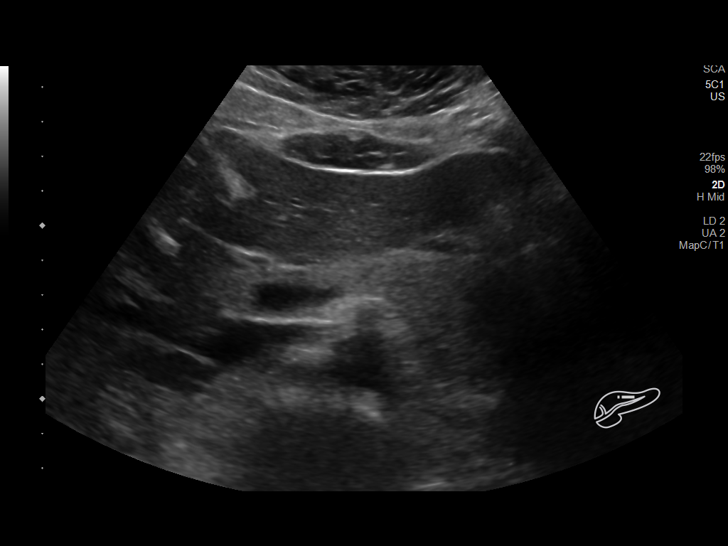
[im 40/61]
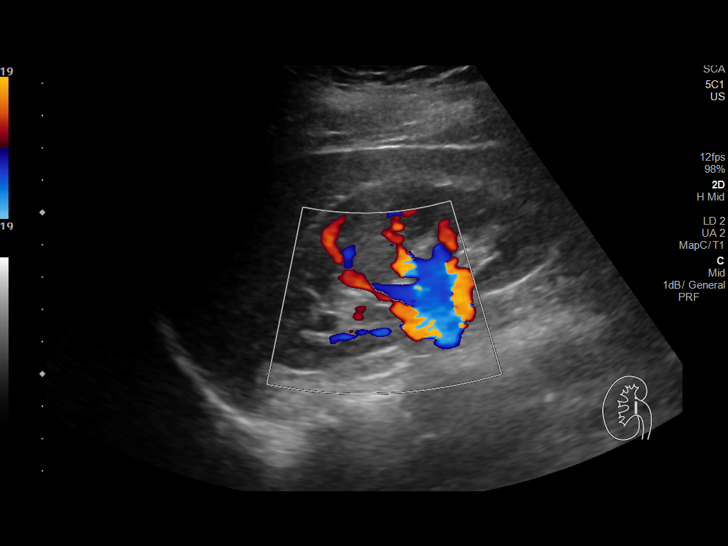
[im 42/61]
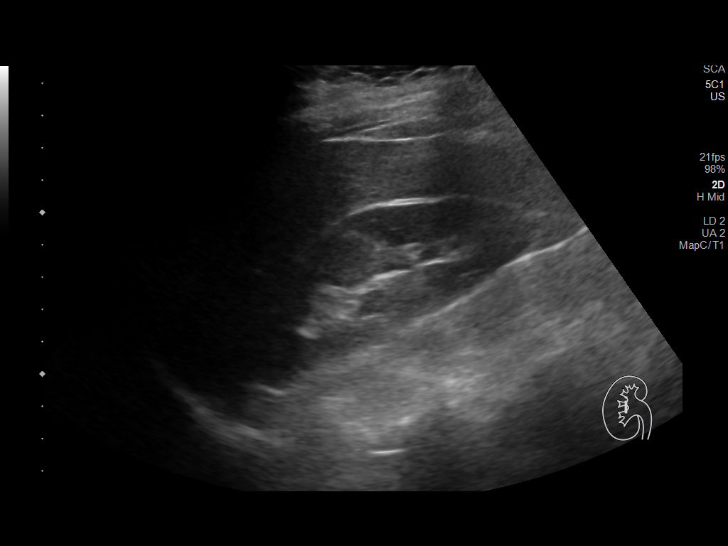
[im 47/61]
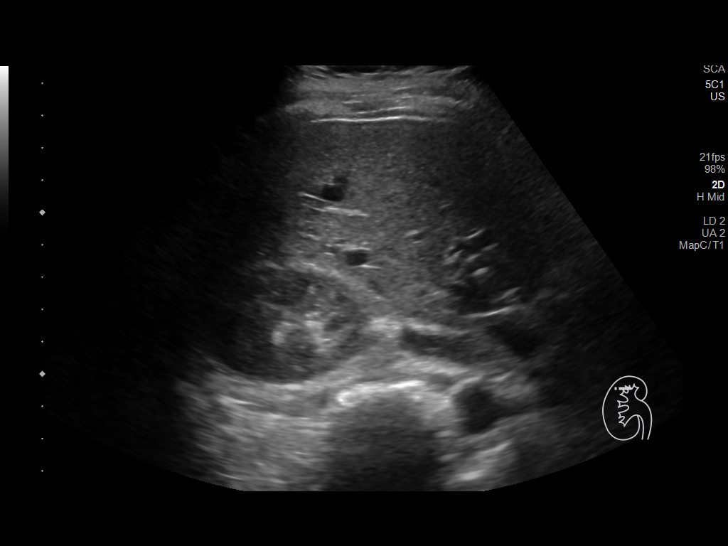
[im 53/61]
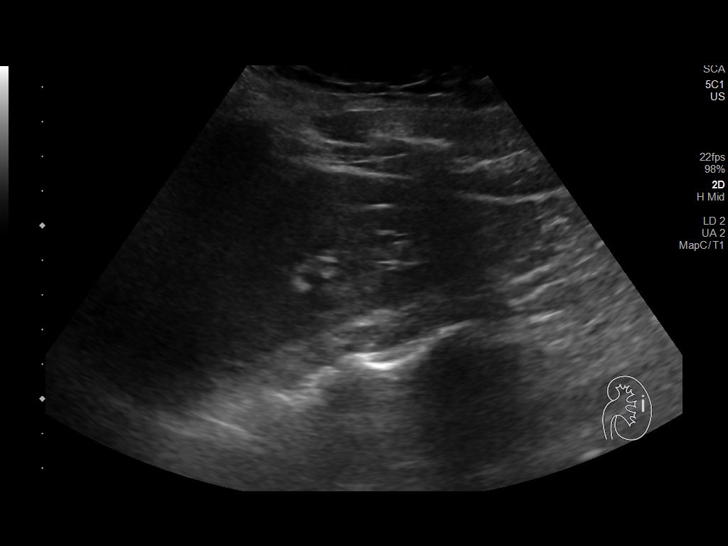
[im 58/61]
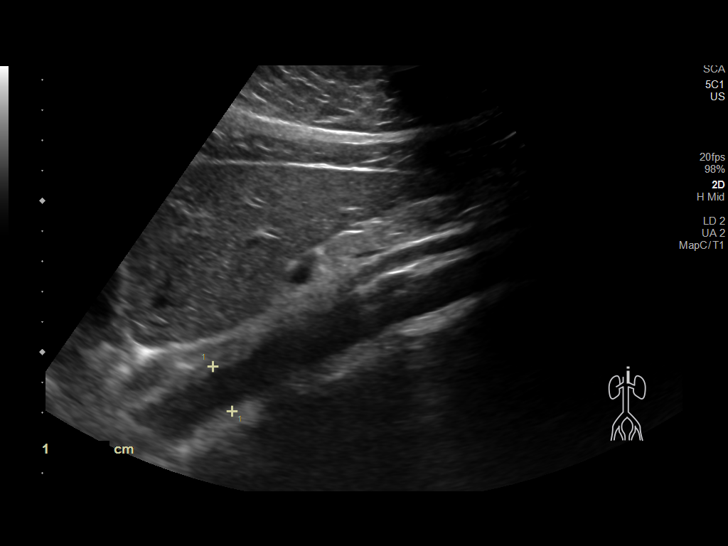

[Series 2: us abdomen complete · 1 of 1 slices shown (2 of 2)]
[im 1/1]
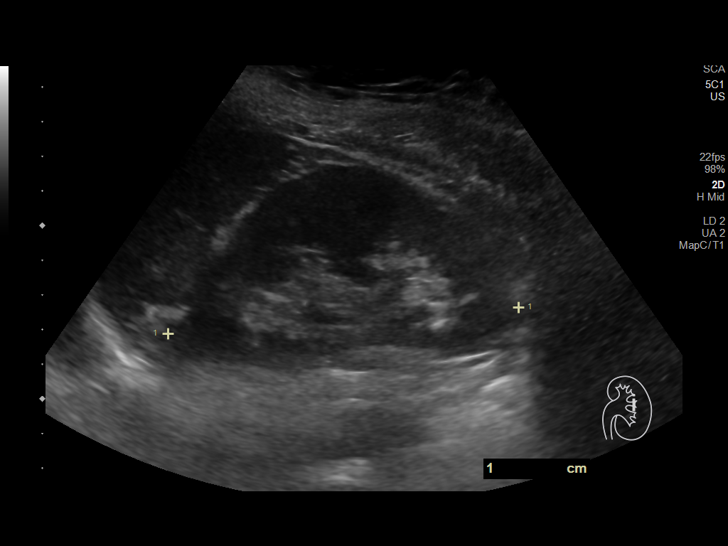

[14 of 25 positions shown; findings below may reference images not displayed]

FINDINGS: Gallbladder: No gallstones or wall thickening visualized. No
sonographic Murphy sign noted by sonographer.

Common bile duct: Diameter: 3.4 mm

Liver: No focal lesion identified. Within normal limits in
parenchymal echogenicity. Portal vein is patent on color Doppler
imaging with normal direction of blood flow towards the liver.

IVC: No abnormality visualized.

Pancreas: Visualized portion unremarkable.

Spleen: Size and appearance within normal limits.

Right Kidney: Length: 9.5 cm. Echogenicity within normal limits. No
mass or hydronephrosis visualized.

Left Kidney: Length: 10.2 cm. Echogenicity within normal limits. No
mass or hydronephrosis visualized.

Abdominal aorta: No aneurysm visualized.

Other findings: None.
IMPRESSION: Negative examination

## 2022-06-25 DIAGNOSIS — R002 Palpitations: Secondary | ICD-10-CM | POA: Insufficient documentation

## 2022-06-25 HISTORY — DX: Palpitations: R00.2

## 2022-07-09 ENCOUNTER — Encounter: Payer: Self-pay | Admitting: Cardiology

## 2022-07-09 ENCOUNTER — Telehealth: Payer: Self-pay | Admitting: Cardiology

## 2022-07-09 ENCOUNTER — Ambulatory Visit: Payer: Managed Care, Other (non HMO) | Attending: Cardiology | Admitting: Cardiology

## 2022-07-09 VITALS — BP 98/64 | HR 82 | Ht 64.0 in | Wt 179.2 lb

## 2022-07-09 DIAGNOSIS — E66811 Obesity, class 1: Secondary | ICD-10-CM

## 2022-07-09 DIAGNOSIS — R002 Palpitations: Secondary | ICD-10-CM | POA: Diagnosis not present

## 2022-07-09 DIAGNOSIS — E669 Obesity, unspecified: Secondary | ICD-10-CM

## 2022-07-09 DIAGNOSIS — R0789 Other chest pain: Secondary | ICD-10-CM

## 2022-07-09 HISTORY — DX: Obesity, unspecified: E66.9

## 2022-07-09 HISTORY — DX: Obesity, class 1: E66.811

## 2022-07-09 NOTE — Progress Notes (Signed)
Cardiology Office Note:    Date:  07/09/2022   ID:  Janice Raymond, DOB 1986/06/20, MRN [id number]  PCP:  [name], PA  Cardiologist:  Garwin Brothers, MD   Referring MD: [name], PA    ASSESSMENT:    1. Chest discomfort   2. Palpitations   3. Obesity (BMI 30.0-34.9)    PLAN:    In order of problems listed above:  Primary prevention stressed with the patient importance of compliance with diet medication stressed and she vocalized understanding.  EKG reveals sinus rhythm and nonspecific ST-T changes Palpitations: I reassured her about my findings.  Monitor report was reviewed and discussed with her at length and questions were answered to her satisfaction.  Monitoring has only revealed atrial runs otherwise unremarkable. Obesity: Weight reduction stressed and diet was emphasized.  Lifestyle modification urged.  I recommend the patient to walk at least half an hour a day on a daily basis and she promises to do so. She has been evaluated for chest pain in the past and CT coronary angiography has been normal.  I discussed this with her at length.  Also her blood pressure is borderline and she takes beta-blocker and told her to keep her well-hydrated with extra salt and water in the diet.  She promises to do so. Patient will be seen in follow-up appointment in 9 months or earlier if the patient has any concerns    Medication Adjustments/Labs and Tests Ordered: Current medicines are reviewed at length with the patient today.  Concerns regarding medicines are outlined above.  No orders of the defined types were placed in this encounter.  No orders of the defined types were placed in this encounter.    History of Present Illness:    Janice Raymond is a 36 y.o. female who is being seen today for the evaluation of palpitations at the request of [name], [place].  Patient is here for second opinion.  She has past medical history of palpitations.  She leads a sedentary  lifestyle.  She has been evaluated extensively and I reviewed records including echo, monitoring and CT coronary angiography reports at extensive length.  She tells me that she has skipped beat feeling at times no dizziness syncope or any such symptoms.  At the time of my evaluation, the patient is alert awake oriented and in no distress.  Past Medical History:  Diagnosis Date   Acute pericarditis, unspecified 12/01/2017   Anxiety 07/23/2021   Last Assessment & Plan:  Formatting of this note might be different from the original. Due to increased stress with school, internship, and work and taking care of son.  Gad screen negative. buspar given to help with symptoms of acute anxiety. Discussed side effects.   Chest discomfort 02/10/2019   Constipation 02/21/2020   Family history of ovarian cancer 11/17/2021   Formatting of this note might be different from the original. 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 Episodically needing acyclovir/valacyclovir. Usually 1 episode per year   GERD (gastroesophageal reflux disease) 11/05/2014   History of pericarditis 03/17/2018   HSV 06/27/2010   Qualifier: Diagnosis of  By: Yetta Barre CMA, Chemira     IBS (irritable bowel syndrome) 03/07/2021   LLQ pain 01/16/2013   Obesity, unspecified 12/28/2013   Palpitations 06/25/2022   Postpartum care following vaginal delivery (8/26) 06/21/2016   Routine gynecological  examination 09/19/2012   S/P D&C (status post dilation and curettage) 10/07/2020   Second-degree perineal laceration, with delivery 06/21/2016    Past Surgical History:  Procedure Laterality Date   TONSILLECTOMY      Current Medications: Current Meds  Medication Sig   dicyclomine (BENTYL) 20 MG tablet Take 1 tablet (20 mg total) by mouth 3 (three) times daily as needed for spasms.   fluticasone (FLONASE) 50 MCG/ACT nasal spray Place 2  sprays into both nostrils daily.   metoprolol succinate (TOPROL-XL) 50 MG 24 hr tablet Take 50 mg by mouth daily.   omeprazole (PRILOSEC) 40 MG capsule TAKE 1 CAPSULE BY MOUTH EVERY DAY   UBRELVY 100 MG TABS Take 100 mg by mouth as needed for migraine.     Allergies:   Semaglutide and Albuterol   Social History   Socioeconomic History   Marital status: Married    Spouse name: Not on file   Number of children: Not on file   Years of education: Not on file   Highest education level: Not on file  Occupational History   Not on file  Tobacco Use   Smoking status: Never   Smokeless tobacco: Never  Vaping Use   Vaping Use: Never used  Substance and Sexual Activity   Alcohol use: Yes    Alcohol/week: 7.0 standard drinks of alcohol    Types: 7 Glasses of wine per week   Drug use: No   Sexual activity: Yes    Birth control/protection: Other-see comments    Comment: Husband had vasectomy  Other Topics Concern   Not on file  Social History Narrative   One son born 2017   Has worked at [employer]   Enjoys sleeping, thrift shopping   Married   Social Determinants of Health   Financial Resource Strain: Not on file  Food Insecurity: Not on file  Transportation Needs: Not on file  Physical Activity: Not on file  Stress: Not on file  Social Connections: Not on file     Family History: The patient's family history includes Breast cancer in her maternal aunt, maternal aunt, and maternal aunt; Depression in her mother; Diabetes in her mother; Prostate cancer in her paternal uncle and paternal uncle; Schizophrenia in her mother; Vitamin D deficiency in her father.  ROS:   Please see the history of present illness.    All other systems reviewed and are negative.  EKGs/Labs/Other Studies Reviewed:    The following studies were reviewed today: Impression  The patient was monitored for a total of 13d 3h, underlying rhythm is Sinus.  The minimum heart rate was 52 bpm; the  maximum 176 bpm; the average 78 bpm.  No  atrial fibrillation/Atrial flutter, VT, AV block.  2 supraventricular episodes were found. Longest SVT Episode 6 beats, Fastest SVT 125 bpm, most likely represents short run of atrial tachycardia.  There were a total of 155 PVCs with 2 morphologies and 0 couplets. Overall PVC Burden at 0.01 %  There were a total of 67 PSVCs with 1 morphologies and 2 couplets. Overall PSVC Burden at  < 0.01 %  -Very low overall ectopy burden  There is a total of 24 patient events most associated with mild sinus tachycardia.  1 episode of shortness of breath associated with sinus tachycardia 160 bpm.   Impression  IMPRESSION:  -  Calcium score of 0.    - Within limitations of study, normal course and trajectory of coronary arteries with no evidence of  plaque, stenosis or calcium.    - Unable to assess plaque/stenosis in distal LAD due to suboptimal contrast timing.  -  An addendum commenting on non-cardiac structures to follow.   Electronically Signed by: Joya Gaskins on 06/22/2022 4:47 PM   Recent Labs: No results found for requested labs within last 365 days.  Recent Lipid Panel    Component Value Date/Time   CHOL 184 02/08/2017 1619   TRIG 155.0 (H) 02/08/2017 1619   HDL 72.40 02/08/2017 1619   CHOLHDL 3 02/08/2017 1619   VLDL 31.0 02/08/2017 1619   LDLCALC 80 02/08/2017 1619   LDLDIRECT 95.3 09/19/2012 1116    Physical Exam:    VS:  BP 98/64   Pulse 82   Ht 5\' 4"  (1.626 m)   Wt 179 lb 3.2 oz (81.3 kg)   SpO2 97%   BMI 30.76 kg/m     Wt Readings from Last 3 Encounters:  07/09/22 179 lb 3.2 oz (81.3 kg)  04/17/20 190 lb 4 oz (86.3 kg)  02/21/20 186 lb 6 oz (84.5 kg)     GEN: Patient is in no acute distress HEENT: Normal NECK: No JVD; No carotid bruits LYMPHATICS: No lymphadenopathy CARDIAC: S1 S2 regular, 2/6 systolic murmur at the apex. RESPIRATORY:  Clear to auscultation without rales, wheezing or rhonchi  ABDOMEN: Soft, non-tender,  non-distended MUSCULOSKELETAL:  No edema; No deformity  SKIN: Warm and dry NEUROLOGIC:  Alert and oriented x 3 PSYCHIATRIC:  Normal affect    Signed, 02/23/20, MD  07/09/2022 10:26 AM    [facility]

## 2022-07-09 NOTE — Patient Instructions (Signed)

## 2022-07-09 NOTE — Telephone Encounter (Signed)
Pt called to state that her heart rate is 123 and that she was having chest pain. Pt has since taken her Metoprolol and is feeling some better. Pt has been seeing [facility]. Pt states states that she is lying down and feels the medication is working. Advised to callback if she gets worse prior to her appointment. Pt verbalized understanding and had no additional questions.

## 2022-07-09 NOTE — Telephone Encounter (Signed)
STAT if HR is under 50 or over 120 (normal HR is 60-100 beats per minute)  What is your heart rate? 123  Do you have a log of your heart rate readings (document readings)? No  Do you have any other symptoms? Chest pains

## 2022-07-16 ENCOUNTER — Encounter: Payer: Self-pay | Admitting: Cardiology

## 2022-07-17 ENCOUNTER — Telehealth: Payer: Self-pay | Admitting: Physician Assistant

## 2022-07-17 ENCOUNTER — Encounter: Payer: Self-pay | Admitting: Cardiology

## 2022-07-17 ENCOUNTER — Telehealth: Payer: Self-pay | Admitting: Cardiology

## 2022-07-17 NOTE — Telephone Encounter (Signed)
Patient was having palpitations this afternoon.  She took a dose of 25 mg metoprolol tartrate which is not listed on her medication list.  Her palpitation has resolved.  She is hesitant to take a full dose of Toprol-XL tonight, systolic blood pressure 300, I instructed the patient to take half a tablet of Toprol-XL tonight and resume full tablet tomorrow night.

## 2022-07-17 NOTE — Telephone Encounter (Signed)
Patient c/o Palpitations:  High priority if patient c/o lightheadedness, shortness of breath, or chest pain  How long have you had palpitations/irregular HR/ Afib? Are you having the symptoms now? Yes  Are you currently experiencing lightheadedness, SOB or CP?   No  Do you have a history of afib (atrial fibrillation) or irregular heart rhythm?   No.  Patient stated this started back in July  Have you checked your BP or HR? (document readings if available):   Yes  Are you experiencing any other symptoms?  Diarrhea  Patient states it feels like a pounding in her chest.  Patient stated her HR 137 about 15 minutes ago.  Patient stated she took medication and it has started to calm down.

## 2022-07-20 NOTE — Telephone Encounter (Signed)
Recommendations reviewed with pt as per Dr. Revankar's note.  Pt verbalized understanding and had no additional questions.   

## 2022-07-20 NOTE — Telephone Encounter (Signed)
Spoke with pt who states that she has bought the [employer] mobile today. Pt states that her heart rate increases throughout the day and she wants to make sure we don't need to change her medication to something else like Corlanor. Pt states on Friday she was taling to her neighbor and her heart rate was in the 90's and jumped to 130's. Today her BP is 133/89 and HR 109. Please advise.

## 2022-07-20 NOTE — Telephone Encounter (Signed)
Patient is still experencing palpitations, calling to see if her meds to be change. Please Advise

## 2022-07-23 ENCOUNTER — Encounter: Payer: Self-pay | Admitting: Cardiology

## 2022-07-24 ENCOUNTER — Other Ambulatory Visit: Payer: Self-pay

## 2022-07-24 ENCOUNTER — Telehealth: Payer: Self-pay | Admitting: Cardiology

## 2022-07-24 MED ORDER — METOPROLOL SUCCINATE ER 50 MG PO TB24: 50.0000 mg | ORAL_TABLET | Freq: Every day | ORAL | 3 refills | Status: DC

## 2022-07-24 NOTE — Telephone Encounter (Signed)
Pt c/o medication issue:  1. Name of Medication: metoprolol succinate (TOPROL-XL) 50 MG 24 hr tablet  2. How are you currently taking this medication (dosage and times per day)? 25 mg in the morning and 25 mg at night  3. Are you having a reaction (difficulty breathing--STAT)? No   4. What is your medication issue? Patient needs a new prescription sent to [id number] - [place], [place]

## 2022-07-27 ENCOUNTER — Telehealth: Payer: Self-pay

## 2022-07-27 NOTE — Telephone Encounter (Signed)
Patient came into the office today stating that she was having chest pain. She was also diaphoretic and visibly short of breath. Her heart rate on her phone was 153. Due to these symptoms I recommended that she go to the ER to be evaluated. Patient stated that she understood and would go to the ER.

## 2022-07-28 ENCOUNTER — Encounter: Payer: Self-pay | Admitting: Cardiology

## 2022-07-28 ENCOUNTER — Telehealth: Payer: Self-pay | Admitting: Cardiology

## 2022-07-28 ENCOUNTER — Other Ambulatory Visit: Payer: Self-pay

## 2022-07-28 NOTE — Telephone Encounter (Signed)
Calling to see if she can get a referral to EP dr. Please advise

## 2022-07-29 ENCOUNTER — Ambulatory Visit: Payer: Managed Care, Other (non HMO) | Attending: Cardiology | Admitting: Cardiology

## 2022-07-29 ENCOUNTER — Encounter: Payer: Self-pay | Admitting: Cardiology

## 2022-07-29 VITALS — BP 106/70 | HR 74 | Ht 64.0 in | Wt 180.0 lb

## 2022-07-29 DIAGNOSIS — R002 Palpitations: Secondary | ICD-10-CM | POA: Diagnosis not present

## 2022-07-29 MED ORDER — IVABRADINE HCL 5 MG PO TABS: 5.0000 mg | ORAL_TABLET | Freq: Two times a day (BID) | ORAL | 3 refills | Status: DC

## 2022-07-29 NOTE — Addendum Note (Signed)
Addended by: Edwyna Shell I on: 07/29/2022 02:35 PM   Modules accepted: Orders

## 2022-07-29 NOTE — Patient Instructions (Signed)
Medication Instructions:  Your physician has recommended you make the following change in your medication:   START: Corlanor 5 mg twice daily  *If you need a refill on your cardiac medications before your next appointment, please call your pharmacy*   Lab Work: None If you have labs (blood work) drawn today and your tests are completely normal, you will receive your results only by: [place] (if you have MyChart) OR A paper copy in the mail If you have any lab test that is abnormal or we need to change your treatment, we will call you to review the results.   Testing/Procedures: None   Follow-Up: At [facility], you and your health needs are our priority.  As part of our continuing mission to provide you with exceptional heart care, we have created designated Provider Care Teams.  These Care Teams include your primary Cardiologist (physician) and Advanced Practice Providers (APPs -  Physician Assistants and Nurse Practitioners) who all work together to provide you with the care you need, when you need it.  We recommend signing up for the patient portal called "MyChart".  Sign up information is provided on this After Visit Summary.  MyChart is used to connect with patients for Virtual Visits (Telemedicine).  Patients are able to view lab/test results, encounter notes, upcoming appointments, etc.  Non-urgent messages can be sent to your provider as well.   To learn more about what you can do with MyChart, go to [email or web address].    Your next appointment:   2 month(s)  The format for your next appointment:   In Person  Provider:   Jyl Heinz, MD    Other Instructions None  Important Information About Sugar

## 2022-07-29 NOTE — Progress Notes (Signed)
Cardiology Office Note:    Date:  07/29/2022   ID:  Janice Raymond, DOB [birth year], MRN [id number]  PCP:  Willeen Niece, PA  Cardiologist:  Jenean Lindau, MD   Referring MD: Willeen Niece, PA    ASSESSMENT:    1. Palpitations    PLAN:    In order of problems listed above:  Palpitations: See primary prevention stressed with the patient.  Importance of compliance with diet medication stressed and she vocalized understanding.  She has a good exercise protocol.  I also discussed with the patient that we could use a medication like Corlanor and see if it will help her.  She is agreeable.  Benefits and potential risks explained and she vocalized understanding.  She mentions to me that her husband has had a vasectomy and she does not plan pregnancy.  I told her that if there is any change in plan she will let us know because we do not want to use this medication if she is contemplating pregnancy.  She understands.Patient will be seen in follow-up appointment in 2 months or earlier if the patient has any concerns    Medication Adjustments/Labs and Tests Ordered: Current medicines are reviewed at length with the patient today.  Concerns regarding medicines are outlined above.  No orders of the defined types were placed in this encounter.  No orders of the defined types were placed in this encounter.    No chief complaint on file.    History of Present Illness:    Janice Raymond is a 36 y.o. female.  Patient was evaluated by me for palpitations.  She is taking a beta-blocker and increased her dose to 50 mg of metoprolol at night and 20 5 in the morning.  She still mentions to me that she has elevated heart rates at times.  She was watching TV and tells me that she was not excited and her heart rate went up to 150.  No chest pain orthopnea or PND.  She walks on a regular basis.  At the time of my evaluation, the patient is alert awake oriented and in no distress.  She has brought her  cardia app to backup her history.  Past Medical History:  Diagnosis Date   Acute pericarditis, unspecified 12/01/2017   Anxiety 07/23/2021   Last Assessment & Plan:  Formatting of this note might be different from the original. Due to increased stress with school, internship, and work and taking care of son.  Gad screen negative. buspar given to help with symptoms of acute anxiety. Discussed side effects.   Chest discomfort 02/10/2019   Constipation 02/21/2020   Family history of ovarian cancer 11/17/2021   Formatting of this note might be different from the original. 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 Episodically needing acyclovir/valacyclovir. Usually 1 episode per year   GERD (gastroesophageal reflux disease) 11/05/2014   History of pericarditis 03/17/2018   HSV 06/27/2010   Qualifier: Diagnosis of  By: Ronnald Ramp CMA, Chemira     IBS (irritable bowel syndrome) 03/07/2021   LLQ pain 01/16/2013   Obesity (BMI 30.0-34.9) 07/09/2022   Obesity, unspecified 12/28/2013   Palpitations 06/25/2022   Postpartum care following vaginal delivery (8/26) 06/21/2016   Routine gynecological examination 09/19/2012   S/P D&C (status post dilation and curettage) 10/07/2020   Second-degree perineal laceration, with delivery 06/21/2016  Past Surgical History:  Procedure Laterality Date   TONSILLECTOMY      Current Medications: Current Meds  Medication Sig   dicyclomine (BENTYL) 20 MG tablet Take 1 tablet (20 mg total) by mouth 3 (three) times daily as needed for spasms.   fluticasone (FLONASE) 50 MCG/ACT nasal spray Place 2 sprays into both nostrils daily.   levocetirizine (XYZAL) 5 MG tablet Take 1 tablet (5 mg total) by mouth every evening.   metoprolol succinate (TOPROL-XL) 50 MG 24 hr tablet Take 25 mg by mouth every morning. And taking 50 mg at night   omeprazole (PRILOSEC) 40 MG  capsule TAKE 1 CAPSULE BY MOUTH EVERY DAY   UBRELVY 100 MG TABS Take 100 mg by mouth as needed for migraine.     Allergies:   Semaglutide and Albuterol   Social History   Socioeconomic History   Marital status: Married    Spouse name: Not on file   Number of children: Not on file   Years of education: Not on file   Highest education level: Not on file  Occupational History   Not on file  Tobacco Use   Smoking status: Never   Smokeless tobacco: Never  Vaping Use   Vaping Use: Never used  Substance and Sexual Activity   Alcohol use: Yes    Alcohol/week: 7.0 standard drinks of alcohol    Types: 7 Glasses of wine per week   Drug use: No   Sexual activity: Yes    Birth control/protection: Other-see comments    Comment: Husband had vasectomy  Other Topics Concern   Not on file  Social History Narrative   One son born 2017   Has worked at [employer]   Enjoys sleeping, thrift shopping   Married   Social Determinants of Health   Financial Resource Strain: Not on file  Food Insecurity: Not on file  Transportation Needs: Not on file  Physical Activity: Not on file  Stress: Not on file  Social Connections: Not on file     Family History: The patient's family history includes Breast cancer in her maternal aunt, maternal aunt, and maternal aunt; Depression in her mother; Diabetes in her mother; Prostate cancer in her paternal uncle and paternal uncle; Schizophrenia in her mother; Vitamin D deficiency in her father.  ROS:   Please see the history of present illness.    All other systems reviewed and are negative.  EKGs/Labs/Other Studies Reviewed:    The following studies were reviewed today: EKG reveals sinus rhythm and nonspecific ST-T changes   Recent Labs: No results found for requested labs within last 365 days.  Recent Lipid Panel    Component Value Date/Time   CHOL 184 02/08/2017 1619   TRIG 155.0 (H) 02/08/2017 1619   HDL 72.40 02/08/2017 1619    CHOLHDL 3 02/08/2017 1619   VLDL 31.0 02/08/2017 1619   LDLCALC 80 02/08/2017 1619   LDLDIRECT 95.3 09/19/2012 1116    Physical Exam:    VS:  BP 106/70   Pulse 74   Ht 5\' 4"  (1.626 m)   Wt 180 lb (81.6 kg)   SpO2 99%   BMI 30.90 kg/m     Wt Readings from Last 3 Encounters:  07/29/22 180 lb (81.6 kg)  07/09/22 179 lb 3.2 oz (81.3 kg)  04/17/20 190 lb 4 oz (86.3 kg)     GEN: Patient is in no acute distress HEENT: Normal NECK: No JVD; No carotid bruits LYMPHATICS: No lymphadenopathy CARDIAC: Hear  sounds regular, 2/6 systolic murmur at the apex. RESPIRATORY:  Clear to auscultation without rales, wheezing or rhonchi  ABDOMEN: Soft, non-tender, non-distended MUSCULOSKELETAL:  No edema; No deformity  SKIN: Warm and dry NEUROLOGIC:  Alert and oriented x 3 PSYCHIATRIC:  Normal affect   Signed, Garwin Brothers, MD  07/29/2022 2:25 PM    [facility]

## 2022-07-29 NOTE — Telephone Encounter (Signed)
Pt has an appt 07/29/22

## 2022-08-03 ENCOUNTER — Encounter: Payer: Self-pay | Admitting: Cardiology

## 2022-08-07 ENCOUNTER — Encounter: Payer: Self-pay | Admitting: Cardiology

## 2022-09-28 ENCOUNTER — Encounter: Payer: Self-pay | Admitting: Cardiology

## 2022-09-28 ENCOUNTER — Ambulatory Visit: Payer: BC Managed Care – PPO | Attending: Cardiology | Admitting: Cardiology

## 2022-09-28 VITALS — BP 96/60 | HR 70 | Ht 64.0 in | Wt 185.4 lb

## 2022-09-28 DIAGNOSIS — E669 Obesity, unspecified: Secondary | ICD-10-CM

## 2022-09-28 DIAGNOSIS — R002 Palpitations: Secondary | ICD-10-CM | POA: Diagnosis not present

## 2022-09-28 NOTE — Progress Notes (Signed)
Cardiology Office Note:    Date:  09/28/2022   ID:  Janice Raymond, DOB February 03, 1986, MRN [id number]  PCP:  [name], PA  Cardiologist:  Garwin Brothers, MD   Referring MD: [name], PA    ASSESSMENT:    1. Palpitations   2. Obesity (BMI 30.0-34.9)    PLAN:    In order of problems listed above:  Primary prevention stressed with the patient.  Importance of compliance with diet medication stressed and she vocalized understanding. Palpitations: Patient is very happy that this has resolved.  She is tolerating beta-blockers well.  Results of the testing discussed with her at length. Obesity: Weight reduction stressed.  Diet emphasized.  She was advised to walk at least 30 to 40 minutes a day without any break for at least 5 days a week and she promises to do so. Patient will be seen in follow-up appointment in 6 months or earlier if the patient has any concerns    Medication Adjustments/Labs and Tests Ordered: Current medicines are reviewed at length with the patient today.  Concerns regarding medicines are outlined above.  No orders of the defined types were placed in this encounter.  No orders of the defined types were placed in this encounter.    No chief complaint on file.    History of Present Illness:    Janice Raymond is a 36 y.o. female.  Patient has past medical history of palpitations.  She denies any problems at this time and takes care of activities of daily living she is able to tolerate beta-blockers well without any symptoms.  She is happy that her heart rate is lower.  She denies any chest pain orthopnea PND palpitations or any syncope.  She has not noticed this past.  Past Medical History:  Diagnosis Date   Acute pericarditis, unspecified 12/01/2017   Anxiety 07/23/2021   Last Assessment & Plan:  Formatting of this note might be different from the original. Due to increased stress with school, internship, and work and taking care of son.  Gad  screen negative. buspar given to help with symptoms of acute anxiety. Discussed side effects.   Chest discomfort 02/10/2019   Constipation 02/21/2020   Family history of ovarian cancer 11/17/2021   Formatting of this note might be different from the original. 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 Episodically needing acyclovir/valacyclovir. Usually 1 episode per year   GERD (gastroesophageal reflux disease) 11/05/2014   History of pericarditis 03/17/2018   HSV 06/27/2010   Qualifier: Diagnosis of  By: Yetta Barre CMA, Chemira     IBS (irritable bowel syndrome) 03/07/2021   LLQ pain 01/16/2013   Obesity (BMI 30.0-34.9) 07/09/2022   Obesity, unspecified 12/28/2013   Palpitations 06/25/2022   Postpartum care following vaginal delivery (8/26) 06/21/2016   Routine gynecological examination 09/19/2012   S/P D&C (status post dilation and curettage) 10/07/2020   Second-degree perineal laceration, with delivery 06/21/2016    Past Surgical History:  Procedure Laterality Date   TONSILLECTOMY      Current Medications: Current Meds  Medication Sig   dicyclomine (BENTYL) 20 MG tablet Take 1 tablet (20 mg total) by mouth 3 (three) times daily as needed for spasms.   fluticasone (FLONASE) 50 MCG/ACT nasal spray Place 2 sprays into both nostrils daily.   levocetirizine (XYZAL) 5 MG tablet Take 1 tablet (5 mg total)  by mouth every evening.   metoprolol succinate (TOPROL-XL) 50 MG 24 hr tablet Take 25 mg by mouth every morning. And taking 50 mg at night   UBRELVY 100 MG TABS Take 100 mg by mouth as needed for migraine.     Allergies:   Semaglutide and Albuterol   Social History   Socioeconomic History   Marital status: Married    Spouse name: Not on file   Number of children: Not on file   Years of education: Not on file   Highest education level: Not on file  Occupational History   Not  on file  Tobacco Use   Smoking status: Never   Smokeless tobacco: Never  Vaping Use   Vaping Use: Never used  Substance and Sexual Activity   Alcohol use: Yes    Alcohol/week: 7.0 standard drinks of alcohol    Types: 7 Glasses of wine per week   Drug use: No   Sexual activity: Yes    Birth control/protection: Other-see comments    Comment: Husband had vasectomy  Other Topics Concern   Not on file  Social History Narrative   One son born 2017   Has worked at [employer]   Enjoys sleeping, thrift shopping   Married   Social Determinants of Health   Financial Resource Strain: Not on file  Food Insecurity: Not on file  Transportation Needs: Not on file  Physical Activity: Not on file  Stress: Not on file  Social Connections: Not on file     Family History: The patient's family history includes Breast cancer in her maternal aunt, maternal aunt, and maternal aunt; Depression in her mother; Diabetes in her mother; Prostate cancer in her paternal uncle and paternal uncle; Schizophrenia in her mother; Vitamin D deficiency in her father.  ROS:   Please see the history of present illness.    All other systems reviewed and are negative.  EKGs/Labs/Other Studies Reviewed:    The following studies were reviewed today: I discussed my findings with the patient at length.   Recent Labs: No results found for requested labs within last 365 days.  Recent Lipid Panel    Component Value Date/Time   CHOL 184 02/08/2017 1619   TRIG 155.0 (H) 02/08/2017 1619   HDL 72.40 02/08/2017 1619   CHOLHDL 3 02/08/2017 1619   VLDL 31.0 02/08/2017 1619   LDLCALC 80 02/08/2017 1619   LDLDIRECT 95.3 09/19/2012 1116    Physical Exam:    VS:  BP 96/60   Pulse 70   Ht 5\' 4"  (1.626 m)   Wt 185 lb 6.4 oz (84.1 kg)   SpO2 98%   BMI 31.82 kg/m     Wt Readings from Last 3 Encounters:  09/28/22 185 lb 6.4 oz (84.1 kg)  07/29/22 180 lb (81.6 kg)  07/09/22 179 lb 3.2 oz (81.3 kg)      GEN: Patient is in no acute distress HEENT: Normal NECK: No JVD; No carotid bruits LYMPHATICS: No lymphadenopathy CARDIAC: Hear sounds regular, 2/6 systolic murmur at the apex. RESPIRATORY:  Clear to auscultation without rales, wheezing or rhonchi  ABDOMEN: Soft, non-tender, non-distended MUSCULOSKELETAL:  No edema; No deformity  SKIN: Warm and dry NEUROLOGIC:  Alert and oriented x 3 PSYCHIATRIC:  Normal affect   Signed, 07/11/22, MD  09/28/2022 2:36 PM    [facility]

## 2022-09-28 NOTE — Patient Instructions (Signed)

## 2022-11-19 ENCOUNTER — Encounter: Payer: Self-pay | Admitting: Cardiology

## 2023-01-01 ENCOUNTER — Encounter: Payer: Self-pay | Admitting: Cardiology

## 2023-02-11 ENCOUNTER — Other Ambulatory Visit: Payer: Self-pay | Admitting: Cardiology

## 2023-02-13 ENCOUNTER — Encounter: Payer: Self-pay | Admitting: Cardiology

## 2023-02-15 MED ORDER — METOPROLOL SUCCINATE ER 50 MG PO TB24: 50.0000 mg | ORAL_TABLET | Freq: Every day | ORAL | 2 refills | Status: DC

## 2023-02-15 MED ORDER — METOPROLOL SUCCINATE ER 25 MG PO TB24: 25.0000 mg | ORAL_TABLET | Freq: Every morning | ORAL | 2 refills | Status: DC

## 2023-02-22 ENCOUNTER — Encounter: Payer: Self-pay | Admitting: Cardiology

## 2023-04-26 ENCOUNTER — Encounter: Payer: Self-pay | Admitting: Cardiology

## 2023-04-26 MED ORDER — METOPROLOL SUCCINATE ER 25 MG PO TB24: 12.5000 mg | ORAL_TABLET | Freq: Every morning | ORAL | 2 refills | Status: DC

## 2023-04-26 NOTE — Addendum Note (Signed)
Addended by: Eleonore Chiquito on: 04/26/2023 01:12 PM   Modules accepted: Orders

## 2023-04-30 MED ORDER — IVABRADINE HCL 5 MG PO TABS: 5.0000 mg | ORAL_TABLET | Freq: Two times a day (BID) | ORAL | 6 refills | Status: DC

## 2023-04-30 NOTE — Addendum Note (Signed)
Addended by: Eleonore Chiquito on: 04/30/2023 07:58 AM   Modules accepted: Orders

## 2023-06-29 ENCOUNTER — Encounter: Payer: Self-pay | Admitting: Cardiology

## 2023-06-29 ENCOUNTER — Ambulatory Visit: Payer: 59 | Attending: Cardiology | Admitting: Cardiology

## 2023-06-29 VITALS — BP 90/64 | HR 74 | Ht 65.0 in | Wt 189.2 lb

## 2023-06-29 DIAGNOSIS — E669 Obesity, unspecified: Secondary | ICD-10-CM | POA: Diagnosis not present

## 2023-06-29 DIAGNOSIS — R011 Cardiac murmur, unspecified: Secondary | ICD-10-CM

## 2023-06-29 DIAGNOSIS — R002 Palpitations: Secondary | ICD-10-CM | POA: Diagnosis not present

## 2023-06-29 NOTE — Patient Instructions (Signed)
Medication Instructions:  Your physician recommends that you continue on your current medications as directed. Please refer to the Current Medication list given to you today.  *If you need a refill on your cardiac medications before your next appointment, please call your pharmacy*   Lab Work: None ordered If you have labs (blood work) drawn today and your tests are completely normal, you will receive your results only by: MyChart Message (if you have MyChart) OR A paper copy in the mail If you have any lab test that is abnormal or we need to change your treatment, we will call you to review the results.   Testing/Procedures: Your physician has requested that you have an echocardiogram. Echocardiography is a painless test that uses sound waves to create images of your heart. It provides your doctor with information about the size and shape of your heart and how well your heart's chambers and valves are working. This procedure takes approximately one hour. There are no restrictions for this procedure. Please do NOT wear cologne, perfume, aftershave, or lotions (deodorant is allowed). Please arrive 15 minutes prior to your appointment time.     Follow-Up: At [facility], you and your health needs are our priority.  As part of our continuing mission to provide you with exceptional heart care, we have created designated Provider Care Teams.  These Care Teams include your primary Cardiologist (physician) and Advanced Practice Providers (APPs -  Physician Assistants and Nurse Practitioners) who all work together to provide you with the care you need, when you need it.  We recommend signing up for the patient portal called "MyChart".  Sign up information is provided on this After Visit Summary.  MyChart is used to connect with patients for Virtual Visits (Telemedicine).  Patients are able to view lab/test results, encounter notes, upcoming appointments, etc.  Non-urgent messages can be sent to  your provider as well.   To learn more about what you can do with MyChart, go to [email or web address].    Your next appointment:   12 month(s)  The format for your next appointment:   In Person  Provider:   Rajan Revankar, MD   Other Instructions Echocardiogram An echocardiogram is a test that uses sound waves (ultrasound) to produce images of the heart. Images from an echocardiogram can provide important information about: Heart size and shape. The size and thickness and movement of your heart's walls. Heart muscle function and strength. Heart valve function or if you have stenosis. Stenosis is when the heart valves are too narrow. If blood is flowing backward through the heart valves (regurgitation). A tumor or infectious growth around the heart valves. Areas of heart muscle that are not working well because of poor blood flow or injury from a heart attack. Aneurysm detection. An aneurysm is a weak or damaged part of an artery wall. The wall bulges out from the normal force of blood pumping through the body. Tell a health care provider about: Any allergies you have. All medicines you are taking, including vitamins, herbs, eye drops, creams, and over-the-counter medicines. Any blood disorders you have. Any surgeries you have had. Any medical conditions you have. Whether you are pregnant or may be pregnant. What are the risks? Generally, this is a safe test. However, problems may occur, including an allergic reaction to dye (contrast) that may be used during the test. What happens before the test? No specific preparation is needed. You may eat and drink normally. What happens during the test? You will   take off your clothes from the waist up and put on a hospital gown. Electrodes or electrocardiogram (ECG)patches may be placed on your chest. The electrodes or patches are then connected to a device that monitors your heart rate and rhythm. You will lie down on a table for an  ultrasound exam. A gel will be applied to your chest to help sound waves pass through your skin. A handheld device, called a transducer, will be pressed against your chest and moved over your heart. The transducer produces sound waves that travel to your heart and bounce back (or "echo" back) to the transducer. These sound waves will be captured in real-time and changed into images of your heart that can be viewed on a video monitor. The images will be recorded on a computer and reviewed by your health care provider. You may be asked to change positions or hold your breath for a short time. This makes it easier to get different views or better views of your heart. In some cases, you may receive contrast through an IV in one of your veins. This can improve the quality of the pictures from your heart. The procedure may vary among health care providers and hospitals.   What can I expect after the test? You may return to your normal, everyday life, including diet, activities, and medicines, unless your health care provider tells you not to do that. Follow these instructions at home: It is up to you to get the results of your test. Ask your health care provider, or the department that is doing the test, when your results will be ready. Keep all follow-up visits. This is important. Summary An echocardiogram is a test that uses sound waves (ultrasound) to produce images of the heart. Images from an echocardiogram can provide important information about the size and shape of your heart, heart muscle function, heart valve function, and other possible heart problems. You do not need to do anything to prepare before this test. You may eat and drink normally. After the echocardiogram is completed, you may return to your normal, everyday life, unless your health care provider tells you not to do that. This information is not intended to replace advice given to you by your health care provider. Make sure you  discuss any questions you have with your health care provider. Document Revised: 06/04/2020 Document Reviewed: 06/04/2020 Elsevier Patient Education  2021 [employer]   Important Information About Sugar        

## 2023-06-29 NOTE — Progress Notes (Signed)
Cardiology Office Note:    Date:  06/29/2023   ID:  Janice Raymond, DOB 1986/01/02, MRN [id number]  PCP:  [name], PA  Cardiologist:  Garwin Brothers, MD   Referring MD: [name], PA    ASSESSMENT:    1. Obesity (BMI 30.0-34.9)   2. Palpitations    PLAN:    In order of problems listed above:  Primary prevention stressed with the patient.  Importance of compliance with diet medication stressed and patient verbalized standing.  She is walking at least half an hour a day on a daily basis and happy about it. Obesity: Weight reduction stressed diet was emphasized. Lipids were reviewed from [facility] sheet Hypertension: This has resolved.  Palpitations have resolved.  She is taking metoprolol on a daily basis and happy about it.  Her blood pressure is borderline but asymptomatic and she will continue this medications.  I told her to keep herself well-hydrated with extra salt and water in the diet as needed. Patient will be seen in follow-up appointment in 12 months or earlier if the patient has any concerns.    Medication Adjustments/Labs and Tests Ordered: Current medicines are reviewed at length with the patient today.  Concerns regarding medicines are outlined above.  No orders of the defined types were placed in this encounter.  No orders of the defined types were placed in this encounter.    No chief complaint on file.    History of Present Illness:    Janice Raymond is a 37 y.o. female.  Patient has past medical history of orthostatic hypotension and hypotension and tachycardia in general.  She could not obtain Corlanor for some strange reason.  She is using beta-blocker and is very happy about it.  No chest pain orthopnea or PND.  At the time of my evaluation, the patient is alert awake oriented and in no distress.  Past Medical History:  Diagnosis Date   Acute pericarditis, unspecified 12/01/2017   Anxiety 07/23/2021   Last Assessment & Plan:  Formatting of  this note might be different from the original. Due to increased stress with school, internship, and work and taking care of son.  Gad screen negative. buspar given to help with symptoms of acute anxiety. Discussed side effects.   Chest discomfort 02/10/2019   Constipation 02/21/2020   Family history of ovarian cancer 11/17/2021   Formatting of this note might be different from the original. 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 Episodically needing acyclovir/valacyclovir. Usually 1 episode per year   GERD (gastroesophageal reflux disease) 11/05/2014   History of pericarditis 03/17/2018   HSV 06/27/2010   Qualifier: Diagnosis of  By: Yetta Barre CMA, Chemira     IBS (irritable bowel syndrome) 03/07/2021   LLQ pain 01/16/2013   Obesity (BMI 30.0-34.9) 07/09/2022   Obesity, unspecified 12/28/2013   Palpitations 06/25/2022   Postpartum care following vaginal delivery (8/26) 06/21/2016   Routine gynecological examination 09/19/2012   S/P D&C (status post dilation and curettage) 10/07/2020   Second-degree perineal laceration, with delivery 06/21/2016    Past Surgical History:  Procedure Laterality Date   TONSILLECTOMY      Current Medications: Current Meds  Medication Sig   dicyclomine (BENTYL) 20 MG tablet Take 1 tablet (20 mg total) by mouth 3 (three) times daily as needed for spasms.   fluticasone (FLONASE) 50 MCG/ACT nasal spray  Place 2 sprays into both nostrils daily.   levocetirizine (XYZAL) 5 MG tablet Take 1 tablet (5 mg total) by mouth every evening.   magnesium oxide (MAG-OX) 400 (240 Mg) MG tablet Take 400 mg by mouth daily.   metoprolol succinate (TOPROL-XL) 25 MG 24 hr tablet Take 25 mg by mouth at bedtime.   omeprazole (PRILOSEC) 40 MG capsule TAKE 1 CAPSULE BY MOUTH EVERY DAY   UBRELVY 100 MG TABS Take 100 mg by mouth as needed for migraine.     Allergies:    Semaglutide and Albuterol   Social History   Socioeconomic History   Marital status: Married    Spouse name: Not on file   Number of children: Not on file   Years of education: Not on file   Highest education level: Not on file  Occupational History   Not on file  Tobacco Use   Smoking status: Never   Smokeless tobacco: Never  Vaping Use   Vaping status: Never Used  Substance and Sexual Activity   Alcohol use: Yes    Alcohol/week: 7.0 standard drinks of alcohol    Types: 7 Glasses of wine per week   Drug use: No   Sexual activity: Yes    Birth control/protection: Other-see comments    Comment: Husband had vasectomy  Other Topics Concern   Not on file  Social History Narrative   One son born 2017   Has worked at [employer]   Enjoys sleeping, thrift shopping   Married   Social Determinants of Health   Financial Resource Strain: Low Risk  (02/12/2023)   Received from [employer], [facility]   Overall Financial Resource Strain (CARDIA)    Difficulty of Paying Living Expenses: Not hard at all  Food Insecurity: No Food Insecurity (02/12/2023)   Received from [facility], [facility]   Hunger Vital Sign    Worried About Running Out of Food in the Last Year: Never true    Ran Out of Food in the Last Year: Never true  Transportation Needs: No Transportation Needs (02/12/2023)   Received from [facility], [facility]   PRAPARE - Transportation    Lack of Transportation (Medical): No    Lack of Transportation (Non-Medical): No  Physical Activity: Sufficiently Active (02/12/2023)   Received from [facility], [facility]   Exercise Vital Sign    Days of Exercise per Week: 3 days    Minutes of Exercise per Session: 50 min  Stress: No Stress Concern Present (02/12/2023)   Received from [facility], [facility] - Occupational Stress Questionnaire    Feeling of Stress : Not at all  Social Connections: Socially  Integrated (02/12/2023)   Received from [facility], [facility]   Social Network    How would you rate your social network (family, work, friends)?: Good participation with social networks     Family History: The patient's family history includes Breast cancer in her maternal aunt, maternal aunt, and maternal aunt; Depression in her mother; Diabetes in her mother; Prostate cancer in her paternal uncle and paternal uncle; Schizophrenia in her mother; Vitamin D deficiency in her father.  ROS:   Please see the history of present illness.    All other systems reviewed and are negative.  EKGs/Labs/Other Studies Reviewed:    The following studies were reviewed today: I discussed my findings with the patient at length   Recent Labs: No results found for requested labs  within last 365 days.  Recent Lipid Panel    Component Value Date/Time   CHOL 184 02/08/2017 1619   TRIG 155.0 (H) 02/08/2017 1619   HDL 72.40 02/08/2017 1619   CHOLHDL 3 02/08/2017 1619   VLDL 31.0 02/08/2017 1619   LDLCALC 80 02/08/2017 1619   LDLDIRECT 95.3 09/19/2012 1116    Physical Exam:    VS:  BP 90/64   Pulse 74   Ht 5\' 5"  (1.651 m)   Wt 189 lb 3.2 oz (85.8 kg)   SpO2 98%   BMI 31.48 kg/m     Wt Readings from Last 3 Encounters:  06/29/23 189 lb 3.2 oz (85.8 kg)  09/28/22 185 lb 6.4 oz (84.1 kg)  07/29/22 180 lb (81.6 kg)     GEN: Patient is in no acute distress HEENT: Normal NECK: No JVD; No carotid bruits LYMPHATICS: No lymphadenopathy CARDIAC: Hear sounds regular, 2/6 systolic murmur at the apex. RESPIRATORY:  Clear to auscultation without rales, wheezing or rhonchi  ABDOMEN: Soft, non-tender, non-distended MUSCULOSKELETAL:  No edema; No deformity  SKIN: Warm and dry NEUROLOGIC:  Alert and oriented x 3 PSYCHIATRIC:  Normal affect   Signed, Garwin Brothers, MD  06/29/2023 9:16 AM    [facility]

## 2023-07-30 ENCOUNTER — Ambulatory Visit: Payer: 59 | Attending: Cardiology

## 2023-07-30 DIAGNOSIS — R011 Cardiac murmur, unspecified: Secondary | ICD-10-CM

## 2023-07-30 LAB — ECHOCARDIOGRAM COMPLETE
Area-P 1/2: 3.85 cm2
S' Lateral: 2.6 cm

## 2023-08-04 ENCOUNTER — Encounter: Payer: Self-pay | Admitting: Cardiology

## 2023-12-08 ENCOUNTER — Other Ambulatory Visit: Payer: Self-pay | Admitting: Cardiology

## 2024-01-20 ENCOUNTER — Other Ambulatory Visit: Payer: Self-pay

## 2024-01-20 ENCOUNTER — Ambulatory Visit: Attending: Physician Assistant

## 2024-01-20 DIAGNOSIS — R293 Abnormal posture: Secondary | ICD-10-CM | POA: Insufficient documentation

## 2024-01-20 DIAGNOSIS — R279 Unspecified lack of coordination: Secondary | ICD-10-CM | POA: Insufficient documentation

## 2024-01-20 DIAGNOSIS — M6281 Muscle weakness (generalized): Secondary | ICD-10-CM | POA: Diagnosis present

## 2024-01-20 DIAGNOSIS — M62838 Other muscle spasm: Secondary | ICD-10-CM | POA: Diagnosis present

## 2024-01-20 NOTE — Therapy (Addendum)
 OUTPATIENT PHYSICAL THERAPY FEMALE PELVIC EVALUATION   Patient Name: Janice Raymond MRN: [id number] DOB:1986-02-11, 38 y.o., female Today's Date: 01/20/2024  END OF SESSION:  PT End of Session - 01/20/24 0846     Visit Number 1    Date for PT Re-Evaluation 07/06/24    Authorization Type [employer]    PT Start Time 0845    PT Stop Time 0925    PT Time Calculation (min) 40 min    Activity Tolerance Patient tolerated treatment well    Behavior During Therapy [facility] for tasks assessed/performed             Past Medical History:  Diagnosis Date   Acute pericarditis, unspecified 12/01/2017   Anxiety 07/23/2021   Last Assessment & Plan:  Formatting of this note might be different from the original. Due to increased stress with school, internship, and work and taking care of son.  Gad screen negative. buspar given to help with symptoms of acute anxiety. Discussed side effects.   Chest discomfort 02/10/2019   Constipation 02/21/2020   Family history of ovarian cancer 11/17/2021   Formatting of this note might be different from the original. 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 Episodically needing acyclovir /valacyclovir . Usually 1 episode per year   GERD (gastroesophageal reflux disease) 11/05/2014   History of pericarditis 03/17/2018   HSV 06/27/2010   Qualifier: Diagnosis of  By: Rochelle Chu CMA, Chemira     IBS (irritable bowel syndrome) 03/07/2021   LLQ pain 01/16/2013   Obesity (BMI 30.0-34.9) 07/09/2022   Obesity, unspecified 12/28/2013   Palpitations 06/25/2022   Postpartum care following vaginal delivery (8/26) 06/21/2016   Routine gynecological examination 09/19/2012   S/P D&C (status post dilation and curettage) 10/07/2020   Second-degree perineal laceration, with delivery 06/21/2016   Past Surgical History:  Procedure Laterality Date   TONSILLECTOMY     Patient Active  Problem List   Diagnosis Date Noted   Obesity (BMI 30.0-34.9) 07/09/2022   Palpitations 06/25/2022   Family history of ovarian cancer 11/17/2021   Anxiety 07/23/2021   IBS (irritable bowel syndrome) 03/07/2021   Genital herpes 12/06/2020   S/P D&C (status post dilation and curettage) 10/07/2020   Constipation 02/21/2020   Chest discomfort 02/10/2019   History of pericarditis 03/17/2018   Acute pericarditis, unspecified 12/01/2017   Ganglion cyst of wrist, left 12/21/2016   Postpartum care following vaginal delivery (8/26) 06/21/2016   Second-degree perineal laceration, with delivery 06/21/2016   GERD (gastroesophageal reflux disease) 11/05/2014   Fatigue 11/05/2014   Obesity, unspecified 12/28/2013   LLQ pain 01/16/2013   Encounter for routine gynecological examination 09/19/2012   Herpes simplex virus (HSV) infection 06/27/2010    PCP: Benita Bramble, PA  REFERRING PROVIDER: Kline, Chianne, PA-C   REFERRING DIAG: N39.3 (ICD-10-CM) - Stress incontinence (female) (female)  THERAPY DIAG:  Muscle weakness (generalized)  Other muscle spasm  Unspecified lack of coordination  Abnormal posture  Rationale for Evaluation and Treatment: Rehabilitation  ONSET DATE: 2017  SUBJECTIVE:  SUBJECTIVE STATEMENT: Pt states that she has been having difficulty with bladder control since vaginal delivery in 2017. In 2023 she got a virus and noticed increase in stress urinary incontinence with coughing/laughing and jumping, like jumping jacks. She states that intercourse is starting to feel differently.  Fluid intake: unsure about how much   PAIN:  Are you having pain? No   PRECAUTIONS: None  RED FLAGS: None   WEIGHT BEARING RESTRICTIONS: No  FALLS:  Has patient fallen in last 6 months?  No  OCCUPATION: identity and asset management for [employer]  ACTIVITY LEVEL : walking, occasional strength training class  PLOF: Independent  PATIENT GOALS: stronger pelvic floor, no leaking, be able to jump  PERTINENT HISTORY:  Anxiety, genital herpes, constipation, IBS, GERD, obesity,   BOWEL MOVEMENT: Pain with bowel movement: No Type of bowel movement:Frequency feels constipated, every other day and Strain little bit, goes through phases Fully empty rectum: No, 50% of the time Leakage: No Pads: No Fiber supplement/laxative No  URINATION: Pain with urination: No Fully empty bladder: Yes:   Stream: Strong Urgency: No Frequency: every 2 hours; 1x/night Leakage: Coughing, Sneezing, Laughing, and jumping Pads: No unless she has a bad cold   INTERCOURSE:  Ability to have vaginal penetration Yes  Pain with intercourse: none DrynessNo Climax: WNL Marinoff Scale: 0/3 Does not feel like it is like it used to be  PREGNANCY: Vaginal deliveries 1 Tearing Yes: second degree tear Episiotomy No C-section deliveries 0 Currently pregnant No  PROLAPSE: Pressure - just on first day of menstrual cycle   OBJECTIVE:  Note: Objective measures were completed at Evaluation unless otherwise noted. 01/20/24:  PATIENT SURVEYS:   PFIQ-7: 19  COGNITION: Overall cognitive status: Within functional limits for tasks assessed     SENSATION: Light touch: Appears intact  FUNCTIONAL TESTS:  Squat: Wt shift to the right, bil knee weakness, but valgus collapse on Rt Single leg stance:  Rt: Lt pelvic drop, poor balance  Lt: no pelvic drop Curl-up test abdominal distortion throughout midline   GAIT: Assistive device utilized: None Comments: WNL  POSTURE: rounded shoulders, forward head, decreased lumbar lordosis, increased thoracic kyphosis, and posterior pelvic tilt   LUMBARAROM/PROM:  A/PROM A/PROM  Eval (% available)  Flexion 80  Extension 100  Right lateral flexion 100   Left lateral flexion 100  Right rotation 100  Left rotation 100   (Blank rows = not tested)  PALPATION:   General: WNL  Pelvic Alignment: Lt posterior rotation  Abdominal: no tenderness; apical breathing pattern                External Perineal Exam: WNL                             Internal Pelvic Floor: some scar tissue restriction, mild discomfort in Lt levator ani with increase in pressure  Patient confirms identification and approves PT to assess internal pelvic floor and treatment Yes  PELVIC MMT:   MMT eval  Vaginal 2/5, 4 second endurance, 7 repeat contractions   Diastasis Recti 1.5 finger width separation; distortion   (Blank rows = not tested)        TONE: Mild increase in Lt levator ani  PROLAPSE: Grade 1 anterior vaginal wall laxity  TODAY'S TREATMENT:  DATE:  01/20/24  EVAL  Neuromuscular re-education: Pt provides verbal consent for internal vaginal/rectal pelvic floor exam. Internal pelvic floor muscle contraction training Quick flicks Long holds The knack   PATIENT EDUCATION:  Education details: See above Person educated: Patient Education method: Explanation, Demonstration, Tactile cues, Verbal cues, and Handouts Education comprehension: verbalized understanding  HOME EXERCISE PROGRAM: [id number]  ASSESSMENT:  CLINICAL IMPRESSION: Patient is a 38 y.o. female who was seen today for physical therapy evaluation and treatment for urinary incontinence. Exam findings notable for abnormal posture, difficulty with appropriate squat and single leg stance form indicating functional core and hip weakness, abdominal weakness and distortion with curl-up test, anterior vaginal wall laxity, pelvic floor muscle wekaness, decreased pelvic floor muscle endurance, difficulty with appropriate pelvic floor muscle coordination, mild increase in Lt  levator ani tone with discomfort, and perineal scar tissue restriction. Signs and symptoms are most consistent with pelvic floor muscle weakness, deep core weakness, and poor pressure management. Initial treatment consisted pelvic floor muscle contraction training and the knack; we also discussed increasing awareness of holding tension in her abdominals and pelvic floor muscles and trying to correct if she can. She will continue to benefit from skilled PT intervention in order to improve urinary incontinence, decrease pelvic floor muscle tone, correct abdominal pressure management, and begin/progress functional strengthening program.   OBJECTIVE IMPAIRMENTS: decreased activity tolerance, decreased coordination, decreased endurance, decreased mobility, decreased strength, increased fascial restrictions, increased muscle spasms, impaired tone, postural dysfunction, and pain.   ACTIVITY LIMITATIONS: standing, squatting, and continence  PARTICIPATION LIMITATIONS: interpersonal relationship, community activity, and occupation  PERSONAL FACTORS: 1 comorbidity: medical history are also affecting patient's functional outcome.   REHAB POTENTIAL: Good  CLINICAL DECISION MAKING: Stable/uncomplicated  EVALUATION COMPLEXITY: Low   GOALS: Goals reviewed with patient? Yes  SHORT TERM GOALS: Target date: 02/17/2024   Pt will be independent with HEP.   Baseline: Goal status: INITIAL  2.  Pt will report 25% improvement in urinary incontinence.  Baseline:  Goal status: INITIAL  3.  Pt will be able to correctly perform diaphragmatic breathing and appropriate pressure management in order to prevent worsening vaginal wall laxity and improve pelvic floor A/ROM.   Baseline:  Goal status: INITIAL  4.  Pt will be independent with diaphragmatic breathing and down training activities in order to improve pelvic floor relaxation.  Baseline:  Goal status: INITIAL  5.  Pt will be independent with the knack,  urge suppression technique, and double voiding in order to improve bladder habits and decrease urinary incontinence.   Baseline:  Goal status: INITIAL  6.  Pt will be independent with use of squatty potty, relaxed toileting mechanics, and improved bowel movement techniques in order to increase ease of bowel movements and complete evacuation.   Baseline:  Goal status: INITIAL  LONG TERM GOALS: Target date: 07/06/24  Pt will be independent with advanced HEP.   Baseline:  Goal status: INITIAL  2.  Pt will report 75% improvement in urinary incontinence.  Baseline:  Goal status: INITIAL  3.  Pt will demonstrate normal pelvic floor muscle tone and A/ROM, able to achieve 4/5 strength with contractions and 10 sec endurance, in order to provide appropriate lumbopelvic support in functional activities.   Baseline:  Goal status: INITIAL  4.  Pt will report no leaks with laughing, coughing, sneezing, and jumping in order to improve comfort with interpersonal relationships and community activities.   Baseline:  Goal status: INITIAL  5.  Pt will demonstrate no abdominal  distortion with curl-up test to demonstrate appropriate abdominal pressure management.  Baseline:  Goal status: INITIAL  6.  Pt will demonstrate normal squat form and single leg stance pelvic stability in order to have improved core strength.  Baseline:  Goal status: INITIAL  PLAN:  PT FREQUENCY: 1-2x/week  PT DURATION: 6 months  PLANNED INTERVENTIONS: 97110-Therapeutic exercises, 97530- Therapeutic activity, 97112- Neuromuscular re-education, 97535- Self Care, 16109- Manual therapy, Dry Needling, and Biofeedback  PLAN FOR NEXT SESSION: improving bowel movements with squatty potty, relaxed toilet mechanics, exhale with pushing, and bowel massage; urge drill and avoiding just in case urinating; begin core training; down training activities   Verlena Glenn, PT, DPT03/27/259:59 AM  PHYSICAL THERAPY DISCHARGE  SUMMARY  Visits from Start of Care: 1  Current functional level related to goals / functional outcomes: Not independent   Remaining deficits: See above   Education / Equipment: HEP   Patient agrees to discharge. Patient goals were not met. Patient is being discharged due to the patient's request.  Verlena Glenn, PT, DPT06/05/252:39 PM

## 2024-01-20 NOTE — Patient Instructions (Signed)
The knack: ?Use this technique while coughing, laughing, sneezing, or with any activities ?that causes you to leak urine a little. ?Right before you perform one of these activities that increase pressure in ?the abdomen and pushes a little urine out, perform a pelvic floor muscle ?contraction and hold. ?If that does not completely stop the leaking, try tightening your thighs ?together in addition to performing a pelvic floor muscle contraction. ?Make sure you are not trying to stifle a cough, sneeze, or laugh; allow these ?activities in full as it will cause less pressure down into the bladder and ?pelvic floor muscles. ? ?

## 2024-02-01 ENCOUNTER — Ambulatory Visit (HOSPITAL_BASED_OUTPATIENT_CLINIC_OR_DEPARTMENT_OTHER)
Admission: EM | Admit: 2024-02-01 | Discharge: 2024-02-01 | Disposition: A | Discharge status: Home / Self Care | Attending: Family Medicine | Admitting: Family Medicine

## 2024-02-01 ENCOUNTER — Encounter (HOSPITAL_BASED_OUTPATIENT_CLINIC_OR_DEPARTMENT_OTHER): Payer: Self-pay

## 2024-02-01 DIAGNOSIS — M5489 Other dorsalgia: Secondary | ICD-10-CM | POA: Diagnosis not present

## 2024-02-01 MED ORDER — KETOROLAC TROMETHAMINE 30 MG/ML IJ SOLN
30.0000 mg | Freq: Once | INTRAMUSCULAR | Status: AC
  Administered 2024-02-01: 30 mg via INTRAMUSCULAR

## 2024-02-01 NOTE — Discharge Instructions (Signed)
 I believe this is muscle spasm/inflammation.  We have given you a Toradol injection here for pain and inflammation.  You can take the baclofen that you have at home if you need that for muscle laxation.  Recommend trying to work the muscle spasm out as I showed you here in clinic. Follow-up with a chiropractor as needed

## 2024-02-01 NOTE — ED Triage Notes (Signed)
 Patient states has had a stressful weekend. Aunt found passed away unexpectedly on 03/10/24. Son having a hard time with her passing. Patient saw chiropractor last Thursday. First time seeing the chiropractor in a long time. Was evaluated for left side shoulder pain, upper back pain. Felt great after adjustment. Woke up this morning with fit bit reporting her HR was 145. Patient took an extra dose of metoprolol. HR on arrival 87. Patient now having pain at area of left shoulder blade with radiation around to left rib area. No shortness of breath. No radiation of pain into neck or jaw. Discomfort with deep breath. Thinks muscular in nature.

## 2024-02-01 NOTE — ED Provider Notes (Signed)
 [facility]    CSN: [id number] Arrival date & time: 02/01/24  0802      History   Chief Complaint No chief complaint on file.   HPI Janice Raymond is a 38 y.o. female.   38 year old female who presents today with left upper back pain.  Describes as sharp and stabbing.  Radiation around to the left chest area.  Came on suddenly this morning after she was bending down to tie her son's shoes.  Heart rate shot up due to pain.  She took a metoprolol which helped with a heart rate.  She has been under a lot of stress due to aunt's recent passing.  The pain is worse with certain movements.  Pain is worse with taking a deep breath.  No shortness of breath, neck or jaw pain.  No persistent chest pain     Past Medical History:  Diagnosis Date   Acute pericarditis, unspecified 12/01/2017   Anxiety 07/23/2021   Last Assessment & Plan:  Formatting of this note might be different from the original. Due to increased stress with school, internship, and work and taking care of son.  Gad screen negative. buspar given to help with symptoms of acute anxiety. Discussed side effects.   Chest discomfort 02/10/2019   Constipation 02/21/2020   Family history of ovarian cancer 11/17/2021   Formatting of this note might be different from the original. 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 Episodically needing acyclovir/valacyclovir. Usually 1 episode per year   GERD (gastroesophageal reflux disease) 11/05/2014   History of pericarditis 03/17/2018   HSV 06/27/2010   Qualifier: Diagnosis of  By: Yetta Barre CMA, Chemira     IBS (irritable bowel syndrome) 03/07/2021   LLQ pain 01/16/2013   Obesity (BMI 30.0-34.9) 07/09/2022   Obesity, unspecified 12/28/2013   Palpitations 06/25/2022   Postpartum care following vaginal delivery (8/26) 06/21/2016   Routine gynecological examination 09/19/2012    S/P D&C (status post dilation and curettage) 10/07/2020   Second-degree perineal laceration, with delivery 06/21/2016    Patient Active Problem List   Diagnosis Date Noted   Obesity (BMI 30.0-34.9) 07/09/2022   Palpitations 06/25/2022   Family history of ovarian cancer 11/17/2021   Anxiety 07/23/2021   IBS (irritable bowel syndrome) 03/07/2021   Genital herpes 12/06/2020   S/P D&C (status post dilation and curettage) 10/07/2020   Constipation 02/21/2020   Chest discomfort 02/10/2019   History of pericarditis 03/17/2018   Acute pericarditis, unspecified 12/01/2017   Ganglion cyst of wrist, left 12/21/2016   Postpartum care following vaginal delivery (8/26) 06/21/2016   Second-degree perineal laceration, with delivery 06/21/2016   GERD (gastroesophageal reflux disease) 11/05/2014   Fatigue 11/05/2014   Obesity, unspecified 12/28/2013   LLQ pain 01/16/2013   Encounter for routine gynecological examination 09/19/2012   Herpes simplex virus (HSV) infection 06/27/2010    Past Surgical History:  Procedure Laterality Date   TONSILLECTOMY      OB History     Gravida  1   Para  1   Term  1   Preterm      AB      Living  1      SAB      IAB      Ectopic      Multiple  0   Live Births  1  Home Medications    Prior to Admission medications   Medication Sig Start Date End Date Taking? Authorizing Provider  dicyclomine (BENTYL) 20 MG tablet Take 1 tablet (20 mg total) by mouth 3 (three) times daily as needed for spasms. 04/17/20   Sheliah Hatch, MD  fluticasone (FLONASE) 50 MCG/ACT nasal spray Place 2 sprays into both nostrils daily. 04/10/21   Eulis Foster, FNP  levocetirizine (XYZAL) 5 MG tablet Take 1 tablet (5 mg total) by mouth every evening. 07/09/20   Waldon Merl, PA-C  magnesium oxide (MAG-OX) 400 (240 Mg) MG tablet Take 400 mg by mouth daily.    [provider]  metoprolol succinate (TOPROL-XL) 25 MG 24 hr tablet Take 1  tablet (25 mg total) by mouth at bedtime. 12/08/23   Revankar, Aundra Dubin, MD  omeprazole (PRILOSEC) 40 MG capsule TAKE 1 CAPSULE BY MOUTH EVERY DAY 10/08/20   Sheliah Hatch, MD  UBRELVY 100 MG TABS Take 100 mg by mouth as needed for migraine. 03/06/22   [provider]    Family History Family History  Problem Relation Age of Onset   Diabetes Mother    Depression Mother    Schizophrenia Mother    Vitamin D deficiency Father    Breast cancer Maternal Aunt    Prostate cancer Paternal Uncle    Breast cancer Maternal Aunt    Breast cancer Maternal Aunt    Prostate cancer Paternal Uncle     Social History Social History   Tobacco Use   Smoking status: Never   Smokeless tobacco: Never  Vaping Use   Vaping status: Never Used  Substance Use Topics   Alcohol use: Yes    Alcohol/week: 7.0 standard drinks of alcohol    Types: 7 Glasses of wine per week   Drug use: No     Allergies   Semaglutide and Albuterol   Review of Systems Review of Systems  See HPI Physical Exam Triage Vital Signs ED Triage Vitals  Encounter Vitals Group     BP 02/01/24 0819 110/77     Systolic BP Percentile --      Diastolic BP Percentile --      Pulse Rate 02/01/24 0819 87     Resp 02/01/24 0819 20     Temp 02/01/24 0819 98.8 F (37.1 C)     Temp Source 02/01/24 0819 Oral     SpO2 02/01/24 0819 98 %     Weight --      Height --      Head Circumference --      Peak Flow --      Pain Score 02/01/24 0820 6     Pain Loc --      Pain Education --      Exclude from Growth Chart --    No data found.  Updated Vital Signs BP 110/77 (BP Location: Right Arm)   Pulse 87   Temp 98.8 F (37.1 C) (Oral)   Resp 20   LMP 01/20/2024   SpO2 98%   Visual Acuity Right Eye Distance:   Left Eye Distance:   Bilateral Distance:    Right Eye Near:   Left Eye Near:    Bilateral Near:     Physical Exam Constitutional:      General: She is not in acute distress. Cardiovascular:      Rate and Rhythm: Normal rate and regular rhythm.     Pulses: Normal pulses.     Heart  sounds: Normal heart sounds.  Pulmonary:     Effort: Pulmonary effort is normal.     Breath sounds: Normal breath sounds.  Musculoskeletal:     Comments: Pain with deep  palpation of scapular area No pain with palpation of chest wall.  Skin:    General: Skin is warm and dry.  Neurological:     Mental Status: She is alert.  Psychiatric:        Mood and Affect: Mood normal.      UC Treatments / Results  Labs (all labs ordered are listed, but only abnormal results are displayed) Labs Reviewed - No data to display  EKG   Radiology No results found.  Procedures Procedures (including critical care time)  Medications Ordered in UC Medications  ketorolac (TORADOL) 30 MG/ML injection 30 mg (30 mg Intramuscular Given 02/01/24 0835)    Initial Impression / Assessment and Plan / UC Course  I have reviewed the triage vital signs and the nursing notes.  Pertinent labs & imaging results that were available during my care of the patient were reviewed by me and considered in my medical decision making (see chart for details).     Back pain-patient having back pain to left scapular area.  Pain reproducible with palpation.  Most likely muscle spasm and inflammation.  Toradol given here for pain. Can do heat to the area.  Recommend massaging the area as shown in clinic. She has follow-up with her chiropractor this afternoon She also has baclofen at home she can take if needed. Follow-up for any worsening symptoms Final Clinical Impressions(s) / UC Diagnoses   Final diagnoses:  Other acute back pain     Discharge Instructions      I believe this is muscle spasm/inflammation.  We have given you a Toradol injection here for pain and inflammation.  You can take the baclofen that you have at home if you need that for muscle laxation.  Recommend trying to work the muscle spasm out as I showed you  here in clinic. Follow-up with a chiropractor as needed    ED Prescriptions   None    PDMP not reviewed this encounter.   Janace Aris, FNP 02/01/24 [phone]

## 2024-02-05 ENCOUNTER — Encounter: Payer: Self-pay | Admitting: Cardiology

## 2024-02-18 ENCOUNTER — Encounter: Payer: Self-pay | Admitting: Cardiology

## 2024-03-08 ENCOUNTER — Ambulatory Visit

## 2024-03-29 ENCOUNTER — Ambulatory Visit: Attending: Physician Assistant

## 2024-03-29 DIAGNOSIS — M6281 Muscle weakness (generalized): Secondary | ICD-10-CM | POA: Insufficient documentation

## 2024-03-29 DIAGNOSIS — R279 Unspecified lack of coordination: Secondary | ICD-10-CM | POA: Insufficient documentation

## 2024-03-29 DIAGNOSIS — M62838 Other muscle spasm: Secondary | ICD-10-CM | POA: Insufficient documentation

## 2024-03-29 DIAGNOSIS — R293 Abnormal posture: Secondary | ICD-10-CM | POA: Insufficient documentation

## 2024-03-29 NOTE — Therapy (Incomplete)
 OUTPATIENT PHYSICAL THERAPY FEMALE PELVIC EVALUATION   Patient Name: Janice Raymond MRN: [id number] DOB:04-29-1986, 38 y.o., female Today's Date: 03/29/2024  END OF SESSION:    Past Medical History:  Diagnosis Date   Acute pericarditis, unspecified 12/01/2017   Anxiety 07/23/2021   Last Assessment & Plan:  Formatting of this note might be different from the original. Due to increased stress with school, internship, and work and taking care of son.  Gad screen negative. buspar given to help with symptoms of acute anxiety. Discussed side effects.   Chest discomfort 02/10/2019   Constipation 02/21/2020   Family history of ovarian cancer 11/17/2021   Formatting of this note might be different from the original. 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 Episodically needing acyclovir /valacyclovir . Usually 1 episode per year   GERD (gastroesophageal reflux disease) 11/05/2014   History of pericarditis 03/17/2018   HSV 06/27/2010   Qualifier: Diagnosis of  By: Rochelle Chu CMA, Chemira     IBS (irritable bowel syndrome) 03/07/2021   LLQ pain 01/16/2013   Obesity (BMI 30.0-34.9) 07/09/2022   Obesity, unspecified 12/28/2013   Palpitations 06/25/2022   Postpartum care following vaginal delivery (8/26) 06/21/2016   Routine gynecological examination 09/19/2012   S/P D&C (status post dilation and curettage) 10/07/2020   Second-degree perineal laceration, with delivery 06/21/2016   Past Surgical History:  Procedure Laterality Date   TONSILLECTOMY     Patient Active Problem List   Diagnosis Date Noted   Obesity (BMI 30.0-34.9) 07/09/2022   Palpitations 06/25/2022   Family history of ovarian cancer 11/17/2021   Anxiety 07/23/2021   IBS (irritable bowel syndrome) 03/07/2021   Genital herpes 12/06/2020   S/P D&C (status post dilation and curettage) 10/07/2020   Constipation 02/21/2020   Chest  discomfort 02/10/2019   History of pericarditis 03/17/2018   Acute pericarditis, unspecified 12/01/2017   Ganglion cyst of wrist, left 12/21/2016   Postpartum care following vaginal delivery (8/26) 06/21/2016   Second-degree perineal laceration, with delivery 06/21/2016   GERD (gastroesophageal reflux disease) 11/05/2014   Fatigue 11/05/2014   Obesity, unspecified 12/28/2013   LLQ pain 01/16/2013   Encounter for routine gynecological examination 09/19/2012   Herpes simplex virus (HSV) infection 06/27/2010    PCP: Benita Bramble, PA  REFERRING PROVIDER: Kline, Chianne, PA-C   REFERRING DIAG: N39.3 (ICD-10-CM) - Stress incontinence (female) (female)  THERAPY DIAG:  No diagnosis found.  Rationale for Evaluation and Treatment: Rehabilitation  ONSET DATE: 2017  SUBJECTIVE:  SUBJECTIVE STATEMENT:   PAIN:  Are you having pain? No   PRECAUTIONS: None  RED FLAGS: None   WEIGHT BEARING RESTRICTIONS: No  FALLS:  Has patient fallen in last 6 months? No  OCCUPATION: identity and asset management for [employer]  ACTIVITY LEVEL : walking, occasional strength training class  PLOF: Independent  PATIENT GOALS: stronger pelvic floor, no leaking, be able to jump  PERTINENT HISTORY:  Anxiety, genital herpes, constipation, IBS, GERD, obesity,   BOWEL MOVEMENT: Pain with bowel movement: No Type of bowel movement:Frequency feels constipated, every other day and Strain little bit, goes through phases Fully empty rectum: No, 50% of the time Leakage: No Pads: No Fiber supplement/laxative No  URINATION: Pain with urination: No Fully empty bladder: Yes:   Stream: Strong Urgency: No Frequency: every 2 hours; 1x/night Leakage: Coughing, Sneezing, Laughing, and jumping Pads: No unless she has a bad  cold   INTERCOURSE:  Ability to have vaginal penetration Yes  Pain with intercourse: none DrynessNo Climax: WNL Marinoff Scale: 0/3 Does not feel like it is like it used to be  PREGNANCY: Vaginal deliveries 1 Tearing Yes: second degree tear Episiotomy No C-section deliveries 0 Currently pregnant No  PROLAPSE: Pressure - just on first day of menstrual cycle   OBJECTIVE:  Note: Objective measures were completed at Evaluation unless otherwise noted. 01/20/24:  PATIENT SURVEYS:   PFIQ-7: 19  COGNITION: Overall cognitive status: Within functional limits for tasks assessed     SENSATION: Light touch: Appears intact  FUNCTIONAL TESTS:  Squat: Wt shift to the right, bil knee weakness, but valgus collapse on Rt Single leg stance:  Rt: Lt pelvic drop, poor balance  Lt: no pelvic drop Curl-up test abdominal distortion throughout midline   GAIT: Assistive device utilized: None Comments: WNL  POSTURE: rounded shoulders, forward head, decreased lumbar lordosis, increased thoracic kyphosis, and posterior pelvic tilt   LUMBARAROM/PROM:  A/PROM A/PROM  Eval (% available)  Flexion 80  Extension 100  Right lateral flexion 100  Left lateral flexion 100  Right rotation 100  Left rotation 100   (Blank rows = not tested)  PALPATION:   General: WNL  Pelvic Alignment: Lt posterior rotation  Abdominal: no tenderness; apical breathing pattern                External Perineal Exam: WNL                             Internal Pelvic Floor: some scar tissue restriction, mild discomfort in Lt levator ani with increase in pressure  Patient confirms identification and approves PT to assess internal pelvic floor and treatment Yes  PELVIC MMT:   MMT eval  Vaginal 2/5, 4 second endurance, 7 repeat contractions   Diastasis Recti 1.5 finger width separation; distortion   (Blank rows = not tested)        TONE: Mild increase in Lt levator ani  PROLAPSE: Grade 1 anterior  vaginal wall laxity  TODAY'S TREATMENT:  DATE:  03/28/24 Manual:  Neuromuscular re-education:  Exercises:  Therapeutic activities:    01/20/24  EVAL  Neuromuscular re-education: Pt provides verbal consent for internal vaginal/rectal pelvic floor exam. Internal pelvic floor muscle contraction training Quick flicks Long holds The knack   PATIENT EDUCATION:  Education details: See above Person educated: Patient Education method: Programmer, multimedia, Demonstration, Tactile cues, Verbal cues, and Handouts Education comprehension: verbalized understanding  HOME EXERCISE PROGRAM: [id number]  ASSESSMENT:  CLINICAL IMPRESSION: She will continue to benefit from skilled PT intervention in order to improve urinary incontinence, decrease pelvic floor muscle tone, correct abdominal pressure management, and begin/progress functional strengthening program.   OBJECTIVE IMPAIRMENTS: decreased activity tolerance, decreased coordination, decreased endurance, decreased mobility, decreased strength, increased fascial restrictions, increased muscle spasms, impaired tone, postural dysfunction, and pain.   ACTIVITY LIMITATIONS: standing, squatting, and continence  PARTICIPATION LIMITATIONS: interpersonal relationship, community activity, and occupation  PERSONAL FACTORS: 1 comorbidity: medical history are also affecting patient's functional outcome.   REHAB POTENTIAL: Good  CLINICAL DECISION MAKING: Stable/uncomplicated  EVALUATION COMPLEXITY: Low   GOALS: Goals reviewed with patient? Yes  SHORT TERM GOALS: Target date: 02/17/2024   Pt will be independent with HEP.   Baseline: Goal status: INITIAL  2.  Pt will report 25% improvement in urinary incontinence.  Baseline:  Goal status: INITIAL  3.  Pt will be able to correctly perform diaphragmatic breathing and  appropriate pressure management in order to prevent worsening vaginal wall laxity and improve pelvic floor A/ROM.   Baseline:  Goal status: INITIAL  4.  Pt will be independent with diaphragmatic breathing and down training activities in order to improve pelvic floor relaxation.  Baseline:  Goal status: INITIAL  5.  Pt will be independent with the knack, urge suppression technique, and double voiding in order to improve bladder habits and decrease urinary incontinence.   Baseline:  Goal status: INITIAL  6.  Pt will be independent with use of squatty potty, relaxed toileting mechanics, and improved bowel movement techniques in order to increase ease of bowel movements and complete evacuation.   Baseline:  Goal status: INITIAL  LONG TERM GOALS: Target date: 07/06/24  Pt will be independent with advanced HEP.   Baseline:  Goal status: INITIAL  2.  Pt will report 75% improvement in urinary incontinence.  Baseline:  Goal status: INITIAL  3.  Pt will demonstrate normal pelvic floor muscle tone and A/ROM, able to achieve 4/5 strength with contractions and 10 sec endurance, in order to provide appropriate lumbopelvic support in functional activities.   Baseline:  Goal status: INITIAL  4.  Pt will report no leaks with laughing, coughing, sneezing, and jumping in order to improve comfort with interpersonal relationships and community activities.   Baseline:  Goal status: INITIAL  5.  Pt will demonstrate no abdominal distortion with curl-up test to demonstrate appropriate abdominal pressure management.  Baseline:  Goal status: INITIAL  6.  Pt will demonstrate normal squat form and single leg stance pelvic stability in order to have improved core strength.  Baseline:  Goal status: INITIAL  PLAN:  PT FREQUENCY: 1-2x/week  PT DURATION: 6 months  PLANNED INTERVENTIONS: 97110-Therapeutic exercises, 97530- Therapeutic activity, 97112- Neuromuscular re-education, 97535- Self Care,  [id number]- Manual therapy, Dry Needling, and Biofeedback  PLAN FOR NEXT SESSION: improving bowel movements with squatty potty, relaxed toilet mechanics, exhale with pushing, and bowel massage; urge drill and avoiding just in case urinating; begin core training; down training activities   Verlena Glenn, PT, DPT06/04/257:51 AM

## 2024-03-30 ENCOUNTER — Encounter: Payer: Self-pay | Admitting: Cardiology

## 2024-03-30 ENCOUNTER — Ambulatory Visit: Attending: Cardiology | Admitting: Cardiology

## 2024-03-30 VITALS — BP 110/80 | HR 87 | Ht 65.0 in | Wt 188.0 lb

## 2024-03-30 DIAGNOSIS — R001 Bradycardia, unspecified: Secondary | ICD-10-CM

## 2024-03-30 DIAGNOSIS — R002 Palpitations: Secondary | ICD-10-CM

## 2024-03-30 DIAGNOSIS — E66811 Obesity, class 1: Secondary | ICD-10-CM | POA: Diagnosis not present

## 2024-03-30 NOTE — Patient Instructions (Signed)

## 2024-03-30 NOTE — Progress Notes (Signed)
 Cardiology Office Note:    Date:  03/30/2024   ID:  Janice Raymond, DOB February 16, 1986, MRN [id number]  PCP:  Kline, Chianne, PA-C  Cardiologist:  Nelia Balzarine, MD   Referring MD: Benita Bramble, PA    ASSESSMENT:    1. Bradycardia   2. Obesity (BMI 30.0-34.9)   3. Palpitations    PLAN:    In order of problems listed above:  I discussed my findings with the patient at length.Primary prevention stressed with the patient.  Importance of compliance with diet medication stressed and patient verbalized standing. Palpitations: These have resolved.  They happen at time that was stressful to her.  She is happy about the fact they have resolved.  She knows to use beta-blockers on a as needed basis.  She has done that in the past. Obesity: Weight reduction stressed diet emphasized and she promises to do better.  She was advised to walk or do aerobic exercise at least half an hour a day on a daily basis. Patient will be seen in follow-up appointment in 6 months or earlier if the patient has any concerns.    Medication Adjustments/Labs and Tests Ordered: Current medicines are reviewed at length with the patient today.  Concerns regarding medicines are outlined above.  Orders Placed This Encounter  Procedures   EKG 12-Lead   No orders of the defined types were placed in this encounter.    Chief Complaint  Patient presents with   Bradycardia     History of Present Illness:    Janice Raymond is a 38 y.o. female.  Patient has past medical history of pericarditis.  She mentions to me that her aunt passed away and at the funeral she had palpitations.  She was concerned about it.  Subsequently this has not happened.  She is an active lady but has not exercised for some time.  At the time of my evaluation, the patient is alert awake oriented and in no distress.  Past Medical History:  Diagnosis Date   Acute pericarditis, unspecified 12/01/2017   Anxiety 07/23/2021   Last Assessment &  Plan:  Formatting of this note might be different from the original. Due to increased stress with school, internship, and work and taking care of son.  Gad screen negative. buspar given to help with symptoms of acute anxiety. Discussed side effects.   Chest discomfort 02/10/2019   Constipation 02/21/2020   Family history of ovarian cancer 11/17/2021   Formatting of this note might be different from the original. 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 Episodically needing acyclovir /valacyclovir . Usually 1 episode per year   GERD (gastroesophageal reflux disease) 11/05/2014   History of pericarditis 03/17/2018   HSV 06/27/2010   Qualifier: Diagnosis of  By: Rochelle Chu CMA, Chemira     IBS (irritable bowel syndrome) 03/07/2021   LLQ pain 01/16/2013   Obesity (BMI 30.0-34.9) 07/09/2022   Obesity, unspecified 12/28/2013   Palpitations 06/25/2022   Postpartum care following vaginal delivery (8/26) 06/21/2016   Routine gynecological examination 09/19/2012   S/P D&C (status post dilation and curettage) 10/07/2020   Second-degree perineal laceration, with delivery 06/21/2016    Past Surgical History:  Procedure Laterality Date   TONSILLECTOMY      Current Medications: Current Meds  Medication Sig   dicyclomine  (BENTYL ) 20 MG tablet Take 1 tablet (20 mg total) by mouth 3 (  three) times daily as needed for spasms.   fluticasone  (FLONASE ) 50 MCG/ACT nasal spray Place 2 sprays into both nostrils daily.   levocetirizine (XYZAL ) 5 MG tablet Take 1 tablet (5 mg total) by mouth every evening.   magnesium oxide (MAG-OX) 400 (240 Mg) MG tablet Take 400 mg by mouth daily.   omeprazole  (PRILOSEC) 40 MG capsule TAKE 1 CAPSULE BY MOUTH EVERY DAY   UBRELVY 100 MG TABS Take 100 mg by mouth as needed for migraine.     Allergies:   Semaglutide and Albuterol   Social History   Socioeconomic History    Marital status: Married    Spouse name: Not on file   Number of children: Not on file   Years of education: Not on file   Highest education level: Not on file  Occupational History   Not on file  Tobacco Use   Smoking status: Never   Smokeless tobacco: Never  Vaping Use   Vaping status: Never Used  Substance and Sexual Activity   Alcohol use: Yes    Alcohol/week: 7.0 standard drinks of alcohol    Types: 7 Glasses of wine per week   Drug use: No   Sexual activity: Yes    Birth control/protection: Other-see comments    Comment: Husband had vasectomy  Other Topics Concern   Not on file  Social History Narrative   One son born 2017   Has worked at [employer]   Enjoys sleeping, thrift shopping   Married   Social Drivers of Corporate investment banker Strain: Low Risk  (01/06/2024)   Received from [facility]   Overall Financial Resource Strain (CARDIA)    Difficulty of Paying Living Expenses: Not hard at all  Food Insecurity: No Food Insecurity (01/06/2024)   Received from [facility]   Hunger Vital Sign    Worried About Running Out of Food in the Last Year: Never true    Ran Out of Food in the Last Year: Never true  Transportation Needs: No Transportation Needs (01/06/2024)   Received from [facility] - Transportation    Lack of Transportation (Medical): No    Lack of Transportation (Non-Medical): No  Physical Activity: Insufficiently Active (01/06/2024)   Received from [facility]   Exercise Vital Sign    Days of Exercise per Week: 3 days    Minutes of Exercise per Session: 40 min  Stress: No Stress Concern Present (01/06/2024)   Received from [facility] - Occupational Stress Questionnaire    Feeling of Stress : Only a little  Social Connections: Socially Integrated (01/06/2024)   Received from [facility]   Social Network    How would you rate your social network (family, work, friends)?: Good  participation with social networks     Family History: The patient's family history includes Breast cancer in her maternal aunt, maternal aunt, and maternal aunt; Depression in her mother; Diabetes in her mother; Prostate cancer in her paternal uncle and paternal uncle; Schizophrenia in her mother; Vitamin D  deficiency in her father.  ROS:   Please see the history of present illness.    All other systems reviewed and are negative.  EKGs/Labs/Other Studies Reviewed:    The following studies were reviewed today: I discussed my findings with the patient at length .Aaron AasEKG Interpretation Date/Time:  Thursday March 30 2024 14:54:32 EDT Ventricular Rate:  87 PR Interval:  166 QRS Duration:  78 QT Interval:  364 QTC Calculation: 438 R Axis:   77  Text Interpretation: Normal sinus rhythm Nonspecific ST-T changes. Confirmed by Hillis Lu [phone]) on 03/30/2024 3:04:46 PM     Recent Labs: No results found for requested labs within last 365 days.  Recent Lipid Panel    Component Value Date/Time   CHOL 184 02/08/2017 1619   TRIG 155.0 (H) 02/08/2017 1619   HDL 72.40 02/08/2017 1619   CHOLHDL 3 02/08/2017 1619   VLDL 31.0 02/08/2017 1619   LDLCALC 80 02/08/2017 1619   LDLDIRECT 95.3 09/19/2012 1116    Physical Exam:    VS:  BP 110/80   Pulse 87   Ht 5\' 5"  (1.651 m)   Wt 188 lb (85.3 kg)   SpO2 99%   BMI 31.28 kg/m     Wt Readings from Last 3 Encounters:  03/30/24 188 lb (85.3 kg)  06/29/23 189 lb 3.2 oz (85.8 kg)  09/28/22 185 lb 6.4 oz (84.1 kg)     GEN: Patient is in no acute distress HEENT: Normal NECK: No JVD; No carotid bruits LYMPHATICS: No lymphadenopathy CARDIAC: Hear sounds regular, 2/6 systolic murmur at the apex. RESPIRATORY:  Clear to auscultation without rales, wheezing or rhonchi  ABDOMEN: Soft, non-tender, non-distended MUSCULOSKELETAL:  No edema; No deformity  SKIN: Warm and dry NEUROLOGIC:  Alert and oriented x 3 PSYCHIATRIC:  Normal affect    Signed, Nelia Balzarine, MD  03/30/2024 3:17 PM    [facility]

## 2024-04-02 ENCOUNTER — Encounter: Payer: Self-pay | Admitting: Cardiology

## 2024-04-04 ENCOUNTER — Encounter (HOSPITAL_BASED_OUTPATIENT_CLINIC_OR_DEPARTMENT_OTHER): Payer: Self-pay | Admitting: Emergency Medicine

## 2024-04-04 ENCOUNTER — Ambulatory Visit (HOSPITAL_BASED_OUTPATIENT_CLINIC_OR_DEPARTMENT_OTHER)
Admission: EM | Admit: 2024-04-04 | Discharge: 2024-04-04 | Disposition: A | Discharge status: Home / Self Care | Attending: Family Medicine | Admitting: Family Medicine

## 2024-04-04 DIAGNOSIS — M542 Cervicalgia: Secondary | ICD-10-CM | POA: Diagnosis not present

## 2024-04-04 DIAGNOSIS — G44209 Tension-type headache, unspecified, not intractable: Secondary | ICD-10-CM

## 2024-04-04 DIAGNOSIS — M546 Pain in thoracic spine: Secondary | ICD-10-CM

## 2024-04-04 DIAGNOSIS — G8929 Other chronic pain: Secondary | ICD-10-CM | POA: Diagnosis not present

## 2024-04-04 MED ORDER — CYCLOBENZAPRINE HCL 5 MG PO TABS: 5.0000 mg | ORAL_TABLET | Freq: Every evening | ORAL | 0 refills | Status: AC | PRN

## 2024-04-04 MED ORDER — KETOROLAC TROMETHAMINE 10 MG PO TABS: 10.0000 mg | ORAL_TABLET | Freq: Four times a day (QID) | ORAL | 0 refills | Status: AC | PRN

## 2024-04-04 MED ORDER — KETOROLAC TROMETHAMINE 30 MG/ML IJ SOLN
30.0000 mg | Freq: Once | INTRAMUSCULAR | Status: AC
  Administered 2024-04-04: 30 mg via INTRAMUSCULAR

## 2024-04-04 NOTE — ED Provider Notes (Signed)
 [facility]    CSN: [id number] Arrival date & time: 04/04/24  1730      History   Chief Complaint Chief Complaint  Patient presents with   Neck Pain    HPI Janice Raymond is a 38 y.o. female.   Patient has had chronic neck and shoulder pain since 2023.  She had an MRI at that time and she feels like something has changed in her neck and shoulder since 2023.  She is having more persistent neck and shoulder pain and chronic headaches.  Normally a chiropractic visit will help her pain is all for 3 weeks or longer.  She saw a chiropractor this week and her pain is not better at all.  She had an MRI at [facility] on 03/31/2024.  She cannot see the results on her portal but wondered if we could see the results.  She has taken acetaminophen  and ibuprofen  with little or no relief.   Neck Pain Associated symptoms: no chest pain and no fever     Past Medical History:  Diagnosis Date   Acute pericarditis, unspecified 12/01/2017   Anxiety 07/23/2021   Last Assessment & Plan:  Formatting of this note might be different from the original. Due to increased stress with school, internship, and work and taking care of son.  Gad screen negative. buspar given to help with symptoms of acute anxiety. Discussed side effects.   Chest discomfort 02/10/2019   Constipation 02/21/2020   Family history of ovarian cancer 11/17/2021   Formatting of this note might be different from the original. 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 Episodically needing acyclovir /valacyclovir . Usually 1 episode per year   GERD (gastroesophageal reflux disease) 11/05/2014   History of pericarditis 03/17/2018   HSV 06/27/2010   Qualifier: Diagnosis of  By: Rochelle Chu CMA, Chemira     IBS (irritable bowel syndrome) 03/07/2021   LLQ pain 01/16/2013   Obesity (BMI 30.0-34.9) 07/09/2022   Obesity, unspecified  12/28/2013   Palpitations 06/25/2022   Postpartum care following vaginal delivery (8/26) 06/21/2016   Routine gynecological examination 09/19/2012   S/P D&C (status post dilation and curettage) 10/07/2020   Second-degree perineal laceration, with delivery 06/21/2016    Patient Active Problem List   Diagnosis Date Noted   Obesity (BMI 30.0-34.9) 07/09/2022   Palpitations 06/25/2022   Family history of ovarian cancer 11/17/2021   Anxiety 07/23/2021   IBS (irritable bowel syndrome) 03/07/2021   Genital herpes 12/06/2020   S/P D&C (status post dilation and curettage) 10/07/2020   Constipation 02/21/2020   Chest discomfort 02/10/2019   History of pericarditis 03/17/2018   Acute pericarditis, unspecified 12/01/2017   Ganglion cyst of wrist, left 12/21/2016   Postpartum care following vaginal delivery (8/26) 06/21/2016   Second-degree perineal laceration, with delivery 06/21/2016   GERD (gastroesophageal reflux disease) 11/05/2014   Fatigue 11/05/2014   Obesity, unspecified 12/28/2013   LLQ pain 01/16/2013   Encounter for routine gynecological examination 09/19/2012   Herpes simplex virus (HSV) infection 06/27/2010    Past Surgical History:  Procedure Laterality Date   TONSILLECTOMY      OB History     Gravida  1   Para  1   Term  1   Preterm      AB      Living  1      SAB  IAB      Ectopic      Multiple  0   Live Births  1            Home Medications    Prior to Admission medications   Medication Sig Start Date End Date Taking? Authorizing Provider  cyclobenzaprine (FLEXERIL) 5 MG tablet Take 1 tablet (5 mg total) by mouth at bedtime as needed for up to 15 days for muscle spasms. 04/04/24 04/19/24 Yes Guss Legacy, FNP  ketorolac  (TORADOL ) 10 MG tablet Take 1 tablet (10 mg total) by mouth every 6 (six) hours as needed. 04/04/24  Yes Guss Legacy, FNP  metoprolol  succinate (TOPROL -XL) 25 MG 24 hr tablet Take 1 tablet (25 mg total) by mouth at  bedtime. 12/08/23  Yes Revankar, Micael Adas, MD  dicyclomine  (BENTYL ) 20 MG tablet Take 1 tablet (20 mg total) by mouth 3 (three) times daily as needed for spasms. 04/17/20   Tabori, Katherine E, MD  fluticasone  (FLONASE ) 50 MCG/ACT nasal spray Place 2 sprays into both nostrils daily. 04/10/21   Webb, Padonda B, FNP  levocetirizine (XYZAL ) 5 MG tablet Take 1 tablet (5 mg total) by mouth every evening. 07/09/20   Farris Hong, PA-C  magnesium oxide (MAG-OX) 400 (240 Mg) MG tablet Take 400 mg by mouth daily.    [provider]  omeprazole  (PRILOSEC) 40 MG capsule TAKE 1 CAPSULE BY MOUTH EVERY DAY 10/08/20   Tabori, Katherine E, MD  UBRELVY 100 MG TABS Take 100 mg by mouth as needed for migraine. 03/06/22   [provider]    Family History Family History  Problem Relation Age of Onset   Diabetes Mother    Depression Mother    Schizophrenia Mother    Vitamin D  deficiency Father    Breast cancer Maternal Aunt    Prostate cancer Paternal Uncle    Breast cancer Maternal Aunt    Breast cancer Maternal Aunt    Prostate cancer Paternal Uncle     Social History Social History   Tobacco Use   Smoking status: Never   Smokeless tobacco: Never  Vaping Use   Vaping status: Never Used  Substance Use Topics   Alcohol use: Yes    Alcohol/week: 7.0 standard drinks of alcohol    Types: 7 Glasses of wine per week   Drug use: No     Allergies   Semaglutide and Albuterol   Review of Systems Review of Systems  Constitutional:  Negative for fever.  Respiratory:  Negative for cough.   Cardiovascular:  Negative for chest pain.  Gastrointestinal:  Negative for abdominal pain, constipation, diarrhea, nausea and vomiting.  Musculoskeletal:  Positive for back pain and neck pain. Negative for arthralgias.  Skin:  Negative for color change and rash.  Neurological:  Negative for syncope.  All other systems reviewed and are negative.    Physical Exam Triage Vital Signs ED Triage  Vitals  Encounter Vitals Group     BP 04/04/24 1755 114/78     Systolic BP Percentile --      Diastolic BP Percentile --      Pulse Rate 04/04/24 1755 76     Resp 04/04/24 1755 18     Temp 04/04/24 1755 97.9 F (36.6 C)     Temp Source 04/04/24 1755 Oral     SpO2 04/04/24 1755 98 %     Weight --      Height --      Head Circumference --  Peak Flow --      Pain Score 04/04/24 1753 5     Pain Loc --      Pain Education --      Exclude from Growth Chart --    No data found.  Updated Vital Signs BP 114/78 (BP Location: Right Arm)   Pulse 76   Temp 97.9 F (36.6 C) (Oral)   Resp 18   LMP 03/27/2024 (Approximate)   SpO2 98%   Visual Acuity Right Eye Distance:   Left Eye Distance:   Bilateral Distance:    Right Eye Near:   Left Eye Near:    Bilateral Near:     Physical Exam Vitals and nursing note reviewed.  Constitutional:      General: She is not in acute distress.    Appearance: She is well-developed. She is not ill-appearing or toxic-appearing.  HENT:     Head: Normocephalic and atraumatic.     Right Ear: External ear normal.     Left Ear: External ear normal.     Nose: Nose normal.     Mouth/Throat:     Lips: Pink.     Mouth: Mucous membranes are moist.  Eyes:     Conjunctiva/sclera: Conjunctivae normal.     Pupils: Pupils are equal, round, and reactive to light.  Cardiovascular:     Rate and Rhythm: Normal rate and regular rhythm.     Heart sounds: S1 normal and S2 normal. No murmur heard. Pulmonary:     Effort: Pulmonary effort is normal. No respiratory distress.     Breath sounds: Normal breath sounds. No decreased breath sounds, wheezing, rhonchi or rales.  Musculoskeletal:        General: No swelling.     Right shoulder: Normal.     Left shoulder: Normal.     Right upper arm: Normal.     Left upper arm: Normal.     Cervical back: Tenderness (Of the sternocleidomastoid muscles and along the cervical spine) present. No swelling, edema,  erythema, rigidity, spasms or torticollis. Normal range of motion.     Thoracic back: Tenderness (along the thoracic spine from T1-T6 and in the muscles towards the scapula.) present. No swelling, edema or spasms. Normal range of motion.  Skin:    General: Skin is warm and dry.     Capillary Refill: Capillary refill takes less than 2 seconds.     Findings: No rash.  Neurological:     Mental Status: She is alert and oriented to person, place, and time.  Psychiatric:        Mood and Affect: Mood normal.      UC Treatments / Results  Labs (all labs ordered are listed, but only abnormal results are displayed) Labs Reviewed - No data to display  EKG   Radiology No results found.  Procedures Procedures (including critical care time)  Medications Ordered in UC Medications  ketorolac  (TORADOL ) 30 MG/ML injection 30 mg (30 mg Intramuscular Given 04/04/24 1836)    Initial Impression / Assessment and Plan / UC Course  I have reviewed the triage vital signs and the nursing notes.  Pertinent labs & imaging results that were available during my care of the patient were reviewed by me and considered in my medical decision making (see chart for details).  Plan of Care: Chronic upper back pain, tension headaches (sagittal follow-up 30 mg IM now.  Ketorolac  10 mg every 6 hours as needed for pain.  May take ketorolac  pills and  work.  Cyclobenzaprine 5 mg between supper and bedtime.  Do not use the cyclobenzaprine and drive.  Follow-up with primary care or orthopedics, whoever ordered the MRI.  Follow-up here as needed.  I reviewed the plan of care with the patient and/or the patient's guardian.  The patient and/or guardian had time to ask questions and acknowledged that the questions were answered.  I provided instruction on symptoms or reasons to return here or to go to an ER, if symptoms/condition did not improve, worsened or if new symptoms occurred.  Final Clinical Impressions(s) / UC  Diagnoses   Final diagnoses:  Cervicalgia  Tension headache  Chronic left-sided thoracic back pain     Discharge Instructions      Chronic upper back pain and neck pain with secondary tension headaches: Ketorolac  30 mg injection in the office now.  Ketorolac  10 mg by mouth every 6 hours as needed for pain.  Cyclobenzaprine 5 mg between supper and bedtime if needed for muscle spasms.  Do not take the cyclobenzaprine and drive.  May take cyclobenzaprine and ketorolac  together.  Follow-up with orthopedics or family doctor regarding MRI having future treatment or management.   ED Prescriptions     Medication Sig Dispense Auth. Provider   cyclobenzaprine (FLEXERIL) 5 MG tablet Take 1 tablet (5 mg total) by mouth at bedtime as needed for up to 15 days for muscle spasms. 15 tablet Rector Devonshire, FNP   ketorolac  (TORADOL ) 10 MG tablet Take 1 tablet (10 mg total) by mouth every 6 (six) hours as needed. 20 tablet Hiroko Tregre, FNP      I have reviewed the PDMP during this encounter.   Guss Legacy, FNP 04/04/24 [phone]

## 2024-04-04 NOTE — Discharge Instructions (Addendum)
 Chronic upper back pain and neck pain with secondary tension headaches: Ketorolac  30 mg injection in the office now.  Ketorolac  10 mg by mouth every 6 hours as needed for pain.  Cyclobenzaprine 5 mg between supper and bedtime if needed for muscle spasms.  Do not take the cyclobenzaprine and drive.  May take cyclobenzaprine and ketorolac  together.  Follow-up with orthopedics or family doctor regarding MRI having future treatment or management.

## 2024-04-04 NOTE — ED Triage Notes (Signed)
 Pt c/o neck/shoulder pain for awhile but was hurting worse today, she did have a MRI done on Friday with [facility]. She has been taken tylenol  and ibuprofen .

## 2024-04-05 ENCOUNTER — Ambulatory Visit

## 2024-04-12 ENCOUNTER — Encounter

## 2024-04-19 ENCOUNTER — Encounter

## 2024-04-26 ENCOUNTER — Encounter

## 2024-05-03 ENCOUNTER — Encounter

## 2024-05-10 ENCOUNTER — Encounter

## 2024-06-04 ENCOUNTER — Other Ambulatory Visit: Payer: Self-pay | Admitting: Cardiology

## 2024-06-27 ENCOUNTER — Encounter: Payer: Self-pay | Admitting: Cardiology

## 2024-07-13 ENCOUNTER — Encounter: Payer: Self-pay | Admitting: Cardiology

## 2024-08-16 ENCOUNTER — Ambulatory Visit: Attending: Cardiology | Admitting: Cardiology

## 2024-08-16 ENCOUNTER — Encounter: Payer: Self-pay | Admitting: Cardiology

## 2024-08-16 VITALS — BP 104/66 | HR 84 | Ht 64.6 in | Wt 189.8 lb

## 2024-08-16 DIAGNOSIS — R002 Palpitations: Secondary | ICD-10-CM

## 2024-08-16 NOTE — Progress Notes (Signed)
 Cardiology Office Note:    Date:  08/16/2024   ID:  Janice Raymond, DOB January 09, 1986, MRN [id number]  PCP:  Kline, Chianne, PA-C  Cardiologist:  Jennifer JONELLE Crape, MD   Referring MD: Kline, Chianne, PA-C    ASSESSMENT:    1. Palpitations    PLAN:    In order of problems listed above:  Primary prevention stressed with the patient.  Importance of compliance with diet medication stressed and patient verbalized standing. Patient was advised to walk at least half an hour a day on a daily basis and she promises to do so. Palpitations: These have resolved.  She was initiated on Lexapro by primary care and she is happy about it.  She sleeps better and palpitations are much better Obesity: Weight reduction stressed diet emphasized.  She promises to do better.  She was advised to exercise on a regular basis. Patient will be seen in follow-up appointment in 9 months or earlier if the patient has any concerns.    Medication Adjustments/Labs and Tests Ordered: Current medicines are reviewed at length with the patient today.  Concerns regarding medicines are outlined above.  No orders of the defined types were placed in this encounter.  No orders of the defined types were placed in this encounter.    No chief complaint on file.    History of Present Illness:    Janice Raymond is a 38 y.o. female.  Patient has past medical history of pericarditis, palpitations.  She denies any problems at this time and takes care of activities of daily living.  No chest pain orthopnea or PND.  At the time of my evaluation, the patient is alert awake oriented and in no distress.  Past Medical History:  Diagnosis Date   Acute pericarditis, unspecified 12/01/2017   Anxiety 07/23/2021   Last Assessment & Plan:  Formatting of this note might be different from the original. Due to increased stress with school, internship, and work and taking care of son.  Gad screen negative. buspar given to help with  symptoms of acute anxiety. Discussed side effects.   Chest discomfort 02/10/2019   Constipation 02/21/2020   Encounter for routine gynecological examination 09/19/2012   IMO SNOMED Dx Update Oct 2024     Family history of ovarian cancer 11/17/2021   Formatting of this note might be different from the original. 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 Episodically needing acyclovir /valacyclovir . Usually 1 episode per year   GERD (gastroesophageal reflux disease) 11/05/2014   Herpes simplex virus (HSV) infection 06/27/2010   Qualifier: Diagnosis of   By: Janice Raymond, Chemira      IMO SNOMED Dx Update Oct 2024     History of pericarditis 03/17/2018   IBS (irritable bowel syndrome) 03/07/2021   LLQ pain 01/16/2013   Obesity (BMI 30.0-34.9) 07/09/2022   Obesity, unspecified 12/28/2013   Palpitations 06/25/2022   Postpartum care following vaginal delivery (8/26) 06/21/2016   S/P D&C (status post dilation and curettage) 10/07/2020   Second-degree perineal laceration, with delivery 06/21/2016    Past Surgical History:  Procedure Laterality Date   TONSILLECTOMY      Current Medications: Current Meds  Medication Sig   dicyclomine  (BENTYL ) 20 MG tablet Take 1 tablet (20 mg total) by mouth 3 (three) times daily as needed for spasms.   escitalopram (LEXAPRO) 5 MG tablet Take 5 mg  by mouth daily.   fluticasone  (FLONASE ) 50 MCG/ACT nasal spray Place 2 sprays into both nostrils daily.   ketorolac  (TORADOL ) 10 MG tablet Take 1 tablet (10 mg total) by mouth every 6 (six) hours as needed.   magnesium oxide (MAG-OX) 400 (240 Mg) MG tablet Take 400 mg by mouth daily.   metoprolol  succinate (TOPROL -XL) 25 MG 24 hr tablet TAKE 1 TABLET BY MOUTH EVERYDAY AT BEDTIME   omeprazole  (PRILOSEC) 40 MG capsule TAKE 1 CAPSULE BY MOUTH EVERY DAY (Patient taking differently: Take 20 mg by mouth  as needed (reflux).)   UBRELVY 100 MG TABS Take 100 mg by mouth as needed for migraine.     Allergies:   Semaglutide and Albuterol   Social History   Socioeconomic History   Marital status: Married    Spouse name: Not on file   Number of children: Not on file   Years of education: Not on file   Highest education level: Not on file  Occupational History   Not on file  Tobacco Use   Smoking status: Never   Smokeless tobacco: Never  Vaping Use   Vaping status: Never Used  Substance and Sexual Activity   Alcohol use: Yes    Alcohol/week: 7.0 standard drinks of alcohol    Types: 7 Glasses of wine per week   Drug use: No   Sexual activity: Yes    Birth control/protection: Other-see comments    Comment: Husband had vasectomy  Other Topics Concern   Not on file  Social History Narrative   One son born 2017   Has worked at [employer]   Enjoys sleeping, thrift shopping   Married   Social Drivers of Corporate investment banker Strain: Low Risk  (06/09/2024)   Received from [facility]   Overall Financial Resource Strain (CARDIA)    How hard is it for you to pay for the very basics like food, housing, medical care, and heating?: Not hard at all  Food Insecurity: No Food Insecurity (06/09/2024)   Received from [facility]   Hunger Vital Sign    Within the past 12 months, you worried that your food would run out before you got the money to buy more.: Never true    Within the past 12 months, the food you bought just didn't last and you didn't have money to get more.: Never true  Transportation Needs: No Transportation Needs (06/09/2024)   Received from [facility] - Transportation    In the past 12 months, has lack of transportation kept you from medical appointments or from getting medications?: No    In the past 12 months, has lack of transportation kept you from meetings, work, or from getting things needed for daily living?: No  Physical Activity:  Sufficiently Active (06/09/2024)   Received from [facility]   Exercise Vital Sign    On average, how many days per week do you engage in moderate to strenuous exercise (like a brisk walk)?: 3 days    On average, how many minutes do you engage in exercise at this level?: 60 min  Stress: No Stress Concern Present (06/09/2024)   Received from [facility] - Occupational Stress Questionnaire    Do you feel stress - tense, restless, nervous, or anxious, or unable to sleep at night because your mind is troubled all the time - these days?: Only a little  Social Connections: Socially Integrated (06/09/2024)  Received from [employer]   Social Network    How would you rate your social network (family, work, friends)?: Good participation with social networks     Family History: The patient's family history includes Breast cancer in her maternal aunt, maternal aunt, and maternal aunt; Depression in her mother; Diabetes in her mother; Prostate cancer in her paternal uncle and paternal uncle; Schizophrenia in her mother; Vitamin D  deficiency in her father.  ROS:   Please see the history of present illness.    All other systems reviewed and are negative.  EKGs/Labs/Other Studies Reviewed:    The following studies were reviewed today: Discussed my findings with the patient at length   Recent Labs: No results found for requested labs within last 365 days.  Recent Lipid Panel    Component Value Date/Time   CHOL 184 02/08/2017 1619   TRIG 155.0 (H) 02/08/2017 1619   HDL 72.40 02/08/2017 1619   CHOLHDL 3 02/08/2017 1619   VLDL 31.0 02/08/2017 1619   LDLCALC 80 02/08/2017 1619   LDLDIRECT 95.3 09/19/2012 1116    Physical Exam:    VS:  BP 104/66   Pulse 84   Ht 5' 4.6 (1.641 m)   Wt 189 lb 12.8 oz (86.1 kg)   SpO2 98%   BMI 31.98 kg/m     Wt Readings from Last 3 Encounters:  08/16/24 189 lb 12.8 oz (86.1 kg)  03/30/24 188 lb (85.3 kg)   06/29/23 189 lb 3.2 oz (85.8 kg)     GEN: Patient is in no acute distress HEENT: Normal NECK: No JVD; No carotid bruits LYMPHATICS: No lymphadenopathy CARDIAC: Hear sounds regular, 2/6 systolic murmur at the apex. RESPIRATORY:  Clear to auscultation without rales, wheezing or rhonchi  ABDOMEN: Soft, non-tender, non-distended MUSCULOSKELETAL:  No edema; No deformity  SKIN: Warm and dry NEUROLOGIC:  Alert and oriented x 3 PSYCHIATRIC:  Normal affect   Signed, Jennifer JONELLE Crape, MD  08/16/2024 3:08 PM    [facility]

## 2024-08-16 NOTE — Patient Instructions (Signed)
